# Patient Record
Sex: Male | Born: 1975 | Race: Black or African American | Hispanic: No | Marital: Married | Smoking: Never smoker
Health system: Southern US, Community
[De-identification: ages and names within clinical notes are randomized; demographics above are authoritative.]

## PROBLEM LIST (undated history)

## (undated) DIAGNOSIS — T7840XA Allergy, unspecified, initial encounter: Secondary | ICD-10-CM

## (undated) HISTORY — DX: Allergy, unspecified, initial encounter: T78.40XA

## (undated) HISTORY — PX: EXCISION / CURETTAGE BONE CYST PHALANGES OF FOOT: SUR479

---

## 2016-06-28 ENCOUNTER — Ambulatory Visit (INDEPENDENT_AMBULATORY_CARE_PROVIDER_SITE_OTHER): Payer: BC Managed Care – PPO | Admitting: Physician Assistant

## 2016-06-28 ENCOUNTER — Encounter: Payer: Self-pay | Admitting: Physician Assistant

## 2016-06-28 VITALS — BP 114/86 | HR 68 | Temp 97.9°F | Resp 16 | Ht 70.0 in | Wt 186.0 lb

## 2016-06-28 DIAGNOSIS — Z0000 Encounter for general adult medical examination without abnormal findings: Secondary | ICD-10-CM

## 2016-06-28 NOTE — Progress Notes (Signed)
Patient ID: Kenneth Garcia MRN: [id number], DOB: [birth year] 41 y.o. Date of Encounter: 06/28/2016, 11:03 AM    Chief Complaint: Physical (CPE). New Patient--Establish Care  HPI: 41 y.o. y/o male here as a New Patient--Establish Care Also wants to do CPE.   He reports that he moved here from [place] about a year ago. When living in [place], would have annual physical. Says that it has probably been about 2 years since his last physical. He is agreeable to update this today but he is not fasting but reports that he can easily come fasting another morning. Says that he just lives about 5 minutes from here.  Reports he has no significant past medical history and takes no medicines on a daily basis.  He does not smoke. I have reviewed his family history and there is no premature family history of CAD or CA.   Review of Systems: Consitutional: No fever, chills, fatigue, night sweats, lymphadenopathy, or weight changes. Eyes: No visual changes, eye redness, or discharge. ENT/Mouth: Ears: No otalgia, tinnitus, hearing loss, discharge. Nose: No congestion, rhinorrhea, sinus pain, or epistaxis. Throat: No sore throat, post nasal drip, or teeth pain. Cardiovascular: No CP, palpitations, diaphoresis, DOE, edema, orthopnea, PND. Respiratory: No cough, hemoptysis, SOB, or wheezing. Gastrointestinal: No anorexia, dysphagia, reflux, pain, nausea, vomiting, hematemesis, diarrhea, constipation, BRBPR, or melena. Genitourinary: No dysuria, frequency, urgency, hematuria, incontinence, nocturia, decreased urinary stream, discharge, impotence, or testicular pain/masses. Musculoskeletal: No decreased ROM, myalgias, stiffness, joint swelling, or weakness. Skin: No rash, erythema, lesion changes, pain, warmth, jaundice, or pruritis. Neurological: No headache, dizziness, syncope, seizures, tremors, memory loss, coordination problems, or paresthesias. Psychological: No anxiety, depression, hallucinations,  SI/HI. Endocrine: No fatigue, polydipsia, polyphagia, polyuria, or known diabetes. All other systems were reviewed and are otherwise negative.  Past Medical History:  Diagnosis Date  . Allergy    pollen     Past Surgical History:  Procedure Laterality Date  . EXCISION / CURETTAGE BONE CYST PHALANGES OF FOOT N/A    both left and right implants in both feet to correct flat feet    Home Meds:  No outpatient prescriptions prior to visit.   No facility-administered medications prior to visit.     Allergies: Allergies no known allergies  Social History   Social History  . Marital status: Married    Spouse name: N/A  . Number of children: N/A  . Years of education: N/A   Occupational History  . Not on file.   Social History Main Topics  . Smoking status: Never Smoker  . Smokeless tobacco: Never Used  . Alcohol use No  . Drug use: No  . Sexual activity: Yes   Other Topics Concern  . Not on file   Social History Narrative  . No narrative on file    Family History  Problem Relation Age of Onset  . Hyperlipidemia Father   . Hypertension Father   . Cancer Maternal Aunt   . COPD Maternal Aunt   . Cancer Maternal Uncle   . Diabetes Maternal Grandfather   . Heart disease Maternal Grandfather     Physical Exam: Blood pressure 114/86, pulse 68, temperature 97.9 F (36.6 C), temperature source Oral, resp. rate 16, height  (1.778 m), weight 186 lb (84.4 kg), SpO2 98 %.  General: Well developed, well nourished, AAM. Appears in no acute distress. HEENT: Normocephalic, atraumatic. Conjunctiva pink, sclera non-icteric. Pupils 2 mm constricting to 1 mm, round, regular, and equally reactive to light and accomodation.  EOMI. Internal auditory canal clear. TMs with good cone of light and without pathology. Nasal mucosa pink. Nares are without discharge. No sinus tenderness. Oral mucosa pink. Pharynx without exudate.   Neck: Supple. Trachea midline. No thyromegaly. Full ROM.  No lymphadenopathy. Lungs: Clear to auscultation bilaterally without wheezes, rales, or rhonchi. Breathing is of normal effort and unlabored. Cardiovascular: RRR with S1 S2. No murmurs, rubs, or gallops. Distal pulses 2+ symmetrically. No carotid or abdominal bruits. Abdomen: Soft, non-tender, non-distended with normoactive bowel sounds. No hepatosplenomegaly or masses. No rebound/guarding. No CVA tenderness. No hernias. Musculoskeletal: Full range of motion and 5/5 strength throughout.  Skin: Warm and moist without erythema, ecchymosis, wounds, or rash. Neuro: A+Ox3. CN II-XII grossly intact. Moves all extremities spontaneously. Full sensation throughout. Normal gait.  Psych:  Responds to questions appropriately with a normal affect.   Assessment/Plan:  41 y.o. y/o AA male here for CPE  -1. Encounter for preventive health examination A. Screening Labs: He is not fasting today but states that he can easily return fasting another morning this week. - CBC with Differential/Platelet; Future - COMPLETE METABOLIC PANEL WITH GFR; Future - Lipid panel; Future - TSH; Future  B. Screening For Prostate Cancer: Start at age 82  C. Screening For Colorectal Cancer:  He has no indication to require this until age 64  D. Immunizations: Flu--------------------------N/A Tetanus------------------ he does not know date of last tetanus but we think this is probably up to date. 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 Pneumococcal--------------- no indication to need this until age 44 Shingles Vaccine---N/A at this age  Signed:   [address] [place], [place]  06/28/2016 11:03 AM

## 2016-06-29 ENCOUNTER — Other Ambulatory Visit: Payer: BC Managed Care – PPO

## 2016-06-29 DIAGNOSIS — Z0000 Encounter for general adult medical examination without abnormal findings: Secondary | ICD-10-CM

## 2016-06-29 LAB — COMPLETE METABOLIC PANEL WITH GFR
ALBUMIN: 3.9 g/dL (ref 3.6–5.1)
ALK PHOS: 50 U/L (ref 40–115)
ALT: 27 U/L (ref 9–46)
AST: 25 U/L (ref 10–40)
BILIRUBIN TOTAL: 0.5 mg/dL (ref 0.2–1.2)
BUN: 26 mg/dL — AB (ref 7–25)
CO2: 24 mmol/L (ref 20–31)
Calcium: 8.9 mg/dL (ref 8.6–10.3)
Chloride: 104 mmol/L (ref 98–110)
Creat: 1.69 mg/dL — ABNORMAL HIGH (ref 0.60–1.35)
GFR, Est African American: 58 mL/min — ABNORMAL LOW (ref >=60)
GFR, Est Non African American: 50 mL/min — ABNORMAL LOW (ref >=60)
GLUCOSE: 86 mg/dL (ref 70–99)
Potassium: 4.4 mmol/L (ref 3.5–5.3)
SODIUM: 136 mmol/L (ref 135–146)
TOTAL PROTEIN: 6.4 g/dL (ref 6.1–8.1)

## 2016-06-29 LAB — CBC WITH DIFFERENTIAL/PLATELET
BASOS ABS: 48 {cells}/uL (ref 0–200)
Basophils Relative: 1 %
EOS ABS: 288 {cells}/uL (ref 15–500)
EOS PCT: 6 %
HCT: 41.3 % (ref 38.5–50.0)
HEMOGLOBIN: 13.4 g/dL (ref 13.0–17.0)
Lymphocytes Relative: 46 %
Lymphs Abs: 2208 cells/uL (ref 850–3900)
MCH: 29.1 pg (ref 27.0–33.0)
MCHC: 32.4 g/dL (ref 32.0–36.0)
MCV: 89.6 fL (ref 80.0–100.0)
MPV: 9.8 fL (ref 7.5–12.5)
Monocytes Absolute: 480 cells/uL (ref 200–950)
Monocytes Relative: 10 %
NEUTROS ABS: 1776 {cells}/uL (ref 1500–7800)
NEUTROS PCT: 37 %
Platelets: 223 10*3/uL (ref 140–400)
RBC: 4.61 MIL/uL (ref 4.20–5.80)
RDW: 13.1 % (ref 11.0–15.0)
WBC: 4.8 10*3/uL (ref 3.8–10.8)

## 2016-06-29 LAB — LIPID PANEL
CHOL/HDL RATIO: 3.7 ratio (ref <5.0)
Cholesterol: 163 mg/dL (ref <200)
HDL: 44 mg/dL (ref >40)
LDL Cholesterol: 104 mg/dL — ABNORMAL HIGH (ref <100)
Triglycerides: 74 mg/dL (ref <150)
VLDL: 15 mg/dL (ref <30)

## 2016-06-29 LAB — TSH: TSH: 2.14 m[IU]/L (ref 0.40–4.50)

## 2016-07-03 ENCOUNTER — Encounter: Payer: Self-pay | Admitting: Physician Assistant

## 2016-09-06 ENCOUNTER — Encounter: Payer: Self-pay | Admitting: Physician Assistant

## 2016-09-06 ENCOUNTER — Ambulatory Visit (INDEPENDENT_AMBULATORY_CARE_PROVIDER_SITE_OTHER): Payer: BC Managed Care – PPO | Admitting: Physician Assistant

## 2016-09-06 ENCOUNTER — Ambulatory Visit
Admission: RE | Admit: 2016-09-06 | Discharge: 2016-09-06 | Disposition: A | Discharge status: Home / Self Care | Payer: BC Managed Care – PPO | Source: Ambulatory Visit | Attending: Physician Assistant | Admitting: Physician Assistant

## 2016-09-06 ENCOUNTER — Other Ambulatory Visit: Payer: Self-pay | Admitting: Physician Assistant

## 2016-09-06 VITALS — BP 110/70 | HR 86 | Temp 98.0°F | Resp 14 | Ht 70.0 in | Wt 194.2 lb

## 2016-09-06 DIAGNOSIS — M25552 Pain in left hip: Secondary | ICD-10-CM

## 2016-09-06 NOTE — Progress Notes (Signed)
    Patient ID: Kenneth AngCalvin Garcia MRN: [id number], DOB: [birth year], 41 y.o. Date of Encounter: 09/06/2016, 8:52 AM    Chief Complaint:  Chief Complaint  Patient presents with  . discomfort between left groin and left femur    ongoing for 1 year      HPI: 41 y.o. year old male presents with above.   Patient states that he has been noticing this off and on for about a year. Says that it hasn't been anything severe but since it wasn't going away, he felt he should come and get it checked. Points his finger along left groin crease.  Says that he feels discomfort in that area  --- when he first wakes up in the morning,   ---when he stands up after sitting a long time,   ---and when he internally rotates hip.   For his work he sits at a desk all day. Says that he exercises 4 days a week. Exercises that he does that involves hip region includes squats and lunges. Says when he does squats and lunges he does not feel any pain in this hip/groin crease while he's doing these exercises. Says that while he is doing these exercises both sides feel the same.  Had noticed no bulge or increased symptoms with these maneuvers to indicate hernia.  However says that when he does his stretches at the end of his exercise, that when he stretches the left hip flexor he feels relief and feels this more on the left than he does the right.  States that he has never had any significant injury to this area of his body that he is aware of. Says that he did run track and also played football elementary through high school.  Has had no pain numbness or tingling down the lateral aspect of the leg. Has had no pain in his low back. No other concerns to address today.     Home Meds:   No outpatient prescriptions prior to visit.   No facility-administered medications prior to visit.     Allergies: No Known Allergies    Review of Systems: See HPI for pertinent ROS. All other ROS negative.    Physical  Exam: Blood pressure 110/70, pulse 86, temperature 98 F (36.7 C), temperature source Oral, resp. rate 14, height 5\' 10"  (1.778 m), weight 194 lb 3.2 oz (88.1 kg), SpO2 98 %., Body mass index is 27.86 kg/m. General: WNWD  AAM. Appears in no acute distress. Neck: Supple. No thyromegaly. No lymphadenopathy. Lungs: Clear bilaterally to auscultation without wheezes, rales, or rhonchi. Breathing is unlabored. Heart: Regular rhythm. No murmurs, rubs, or gallops. Msk:  Strength and tone normal for age. Full range of motion at hip joint. Strength normal. Extremities/Skin: Warm and dry.  No rashes . Neuro: Alert and oriented X 3. Moves all extremities spontaneously. Gait is normal. CNII-XII grossly in tact. Psych:  Responds to questions appropriately with a normal affect.     ASSESSMENT AND PLAN:  41 y.o. year old male with  1. Left hip pain Will obtain x-ray hip. Follow-up with him once again that report. If x-ray shows no significant abnormality then I suspect his symptoms are secondary to tight muscles in that area. I have demonstrated 2 specific stretches for him to do to stretch hip flexors. - DG Arthro Hip Left; Future   Signed, Shon HaleMary Beth AmagansettDixon, [place]PA, [facility] 09/06/2016 8:52 AM

## 2017-02-27 HISTORY — PX: VASECTOMY: SHX75

## 2017-03-11 ENCOUNTER — Encounter: Payer: Self-pay | Admitting: Physician Assistant

## 2017-05-08 ENCOUNTER — Encounter: Payer: Self-pay | Admitting: Physician Assistant

## 2017-05-08 DIAGNOSIS — R7989 Other specified abnormal findings of blood chemistry: Secondary | ICD-10-CM

## 2017-05-08 DIAGNOSIS — R945 Abnormal results of liver function studies: Principal | ICD-10-CM

## 2017-05-15 ENCOUNTER — Ambulatory Visit
Admission: RE | Admit: 2017-05-15 | Discharge: 2017-05-15 | Disposition: A | Discharge status: Home / Self Care | Payer: BC Managed Care – PPO | Source: Ambulatory Visit | Attending: Physician Assistant | Admitting: Physician Assistant

## 2017-05-15 DIAGNOSIS — R945 Abnormal results of liver function studies: Principal | ICD-10-CM

## 2017-05-15 DIAGNOSIS — R7989 Other specified abnormal findings of blood chemistry: Secondary | ICD-10-CM

## 2017-05-16 ENCOUNTER — Telehealth: Payer: Self-pay

## 2017-05-16 NOTE — Telephone Encounter (Signed)
Call was placed to patient to discuss ultrasound results. Patient would like to know if he could have further testing done such as Ct scan or MRI  To find out why his GGT  is elevated because his life insurance company is not giving him a good rate due to this problem.

## 2017-05-16 NOTE — Telephone Encounter (Signed)
Further workup of this is not indicated.  I just further researched this issue. The following is copied directly from medical literature:  "GGT is found in hepatocytes and biliary epithelial cells, as well as in the kidney, seminal vesicles, pancreas, spleen, heart, and brain. "  This is something non-specific that was measured on his lab work.  It has no clinical significance.

## 2017-05-16 NOTE — Telephone Encounter (Signed)
-----   Message from Dorena BodoMary B Dixon, PA-C sent at 05/15/2017 11:17 AM EDT ----- Ultrasound test is normal. Elevated GGT insignificant. Other LFTs normal and ultrasound normal.

## 2017-05-16 NOTE — Telephone Encounter (Signed)
Call placed to patient he is aware of provider recommendations 

## 2017-07-02 ENCOUNTER — Other Ambulatory Visit: Payer: Self-pay

## 2017-07-02 ENCOUNTER — Ambulatory Visit (INDEPENDENT_AMBULATORY_CARE_PROVIDER_SITE_OTHER): Payer: BC Managed Care – PPO | Admitting: Physician Assistant

## 2017-07-02 ENCOUNTER — Encounter: Payer: Self-pay | Admitting: Physician Assistant

## 2017-07-02 VITALS — BP 119/78 | HR 61 | Temp 97.6°F | Resp 14 | Ht 71.0 in | Wt 183.4 lb

## 2017-07-02 DIAGNOSIS — Z0000 Encounter for general adult medical examination without abnormal findings: Secondary | ICD-10-CM

## 2017-07-02 NOTE — Progress Notes (Signed)
Patient ID: Kenneth Garcia MRN: [id number], DOB: 1975/06/19 43 y.o. Date of Encounter: 07/02/2017, 8:58 AM    Chief Complaint: Physical (CPE). New Patient--Establish Care  HPI: 42 y.o. y/o male    06/28/2016: here as a New Patient--Establish Care Also wants to do CPE.   He reports that he moved here from [place] about a year ago. When living in [place], would have annual physical. Says that it has probably been about 2 years since his last physical. He is agreeable to update this today but he is not fasting but reports that he can easily come fasting another morning. Says that he just lives about 5 minutes from here.  Reports he has no significant past medical history and takes no medicines on a daily basis.  He does not smoke. I have reviewed his family history and there is no premature family history of CAD or CA.   07/02/2017: He presents for complete physical exam again today. He reports that everything has been stable over the past year. He has no specific concerns to address today. He reports that there also have been no updates to his family's medical history over the past year. He is fasting to do lab work today.   Review of Systems: Consitutional: No fever, chills, fatigue, night sweats, lymphadenopathy, or weight changes. Eyes: No visual changes, eye redness, or discharge. ENT/Mouth: Ears: No otalgia, tinnitus, hearing loss, discharge. Nose: No congestion, rhinorrhea, sinus pain, or epistaxis. Throat: No sore throat, post nasal drip, or teeth pain. Cardiovascular: No CP, palpitations, diaphoresis, DOE, edema, orthopnea, PND. Respiratory: No cough, hemoptysis, SOB, or wheezing. Gastrointestinal: No anorexia, dysphagia, reflux, pain, nausea, vomiting, hematemesis, diarrhea, constipation, BRBPR, or melena. Genitourinary: No dysuria, frequency, urgency, hematuria, incontinence, nocturia, decreased urinary stream, discharge, impotence, or testicular  pain/masses. Musculoskeletal: No decreased ROM, myalgias, stiffness, joint swelling, or weakness. Skin: No rash, erythema, lesion changes, pain, warmth, jaundice, or pruritis. Neurological: No headache, dizziness, syncope, seizures, tremors, memory loss, coordination problems, or paresthesias. Psychological: No anxiety, depression, hallucinations, SI/HI. Endocrine: No fatigue, polydipsia, polyphagia, polyuria, or known diabetes. All other systems were reviewed and are otherwise negative.  Past Medical History:  Diagnosis Date  . Allergy    pollen     Past Surgical History:  Procedure Laterality Date  . EXCISION / CURETTAGE BONE CYST PHALANGES OF FOOT N/A    both left and right implants in both feet to correct flat feet    Home Meds:  Outpatient Medications Prior to Visit  Medication Sig Dispense Refill  . glucosamine-chondroitin 500-400 MG tablet Take 1 tablet by mouth 3 (three) times daily. Patient  taking 500 mgTID    . Multiple Vitamin (MULTIVITAMIN) tablet Take 4 tablets by mouth daily. Garden of life vitamin code men    . Omega-3 Fatty Acids (FISH OIL PO) Take 1 capsule by mouth daily.    . Probiotic Product (ALOE 91478 & PROBIOTICS PO) Take by mouth. Patient taking 10 billion daily     No facility-administered medications prior to visit.     Allergies: No Known Allergies  Social History   Socioeconomic History  . Marital status: Married    Spouse name: Not on file  . Number of children: Not on file  . Years of education: Not on file  . Highest education level: Not on file  Occupational History  . Not on file  Social Needs  . Financial resource strain: Not on file  . Food insecurity:    Worry: Not on file  Inability: Not on file  . Transportation needs:    Medical: Not on file    Non-medical: Not on file  Tobacco Use  . Smoking status: Never Smoker  . Smokeless tobacco: Never Used  Substance and Sexual Activity  . Alcohol use: No  . Drug use: No  .  Sexual activity: Yes  Lifestyle  . Physical activity:    Days per week: Not on file    Minutes per session: Not on file  . Stress: Not on file  Relationships  . Social connections:    Talks on phone: Not on file    Gets together: Not on file    Attends religious service: Not on file    Active member of club or organization: Not on file    Attends meetings of clubs or organizations: Not on file    Relationship status: Not on file  . Intimate partner violence:    Fear of current or ex partner: Not on file    Emotionally abused: Not on file    Physically abused: Not on file    Forced sexual activity: Not on file  Other Topics Concern  . Not on file  Social History Narrative  . Not on file    Family History  Problem Relation Age of Onset  . Hyperlipidemia Father   . Hypertension Father   . Cancer Maternal Aunt   . COPD Maternal Aunt   . Cancer Maternal Uncle   . Diabetes Maternal Grandfather   . Heart disease Maternal Grandfather     Physical Exam: Blood pressure 119/78, pulse 61, temperature 97.6 F (36.4 C), temperature source Oral, resp. rate 14, height  (1.803 m), weight 83.2 kg (183 lb 6.4 oz), SpO2 97 %., Body mass index is 25.58 kg/m. General: WNWD AAM. Appears in no acute distress. Head: Normocephalic, atraumatic, eyes without discharge, sclera non-icteric, nares are without discharge. Bilateral auditory canals clear, TM's are without perforation, pearly grey and translucent with reflective cone of light bilaterally. Oral cavity moist, posterior pharynx without exudate, erythema, peritonsillar abscess, or post nasal drip.  Neck: Supple. No thyromegaly. No lymphadenopathy.  No carotid bruits. Lungs: Clear bilaterally to auscultation without wheezes, rales, or rhonchi. Breathing is unlabored. Heart: RRR with S1 S2. No murmurs, rubs, or gallops. Abdomen: Soft, non-tender, non-distended with normoactive bowel sounds. No hepatomegaly. No rebound/guarding. No obvious  abdominal masses. Musculoskeletal:  Strength and tone normal for age. Extremities/Skin: Warm and dry. No clubbing or cyanosis. No edema. No rashes or suspicious lesions. Neuro: Alert and oriented X 3. Moves all extremities spontaneously. Gait is normal. CNII-XII grossly in tact. Psych:  Responds to questions appropriately with a normal affect.   Assessment/Plan:  42 y.o. y/o AA male here for CPE  -1. Encounter for preventive health examination A. Screening Labs: 07/02/2017: He is fasting  - CBC with Differential/Platelet - COMPLETE METABOLIC PANEL WITH GFR - Lipid panel - HIV antibody  B. Screening For Prostate Cancer: 07/02/2017:Start at age 70  C. Screening For Colorectal Cancer:  07/02/2017:He has no indication to require this until age 80  D. Immunizations: Flu--------------------------N/A Tetanus------------------ he does not know date of last tetanus but we think this is probably up to date. 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 Pneumococcal--------------- no indication to need this until age 76 Shingles Vaccine---N/A at this age  Signed:   [address] [place], [place]  07/02/2017 8:58 AM

## 2017-07-03 LAB — CBC WITH DIFFERENTIAL/PLATELET
BASOS PCT: 0.8 %
Basophils Absolute: 38 cells/uL (ref 0–200)
Eosinophils Absolute: 211 cells/uL (ref 15–500)
Eosinophils Relative: 4.4 %
HEMATOCRIT: 42.6 % (ref 38.5–50.0)
Hemoglobin: 13.9 g/dL (ref 13.2–17.1)
LYMPHS ABS: 2002 {cells}/uL (ref 850–3900)
MCH: 28.2 pg (ref 27.0–33.0)
MCHC: 32.6 g/dL (ref 32.0–36.0)
MCV: 86.4 fL (ref 80.0–100.0)
MPV: 10.5 fL (ref 7.5–12.5)
Monocytes Relative: 8.6 %
Neutro Abs: 2136 cells/uL (ref 1500–7800)
Neutrophils Relative %: 44.5 %
PLATELETS: 230 10*3/uL (ref 140–400)
RBC: 4.93 10*6/uL (ref 4.20–5.80)
RDW: 12.6 % (ref 11.0–15.0)
TOTAL LYMPHOCYTE: 41.7 %
WBC: 4.8 10*3/uL (ref 3.8–10.8)
WBCMIX: 413 {cells}/uL (ref 200–950)

## 2017-07-03 LAB — COMPLETE METABOLIC PANEL WITH GFR
AG RATIO: 1.7 (calc) (ref 1.0–2.5)
ALT: 30 U/L (ref 9–46)
AST: 20 U/L (ref 10–40)
Albumin: 4.3 g/dL (ref 3.6–5.1)
Alkaline phosphatase (APISO): 67 U/L (ref 40–115)
BILIRUBIN TOTAL: 0.5 mg/dL (ref 0.2–1.2)
BUN/Creatinine Ratio: 11 (calc) (ref 6–22)
BUN: 16 mg/dL (ref 7–25)
CO2: 28 mmol/L (ref 20–32)
CREATININE: 1.48 mg/dL — AB (ref 0.60–1.35)
Calcium: 9.4 mg/dL (ref 8.6–10.3)
Chloride: 105 mmol/L (ref 98–110)
GFR, Est African American: 68 mL/min/{1.73_m2} (ref >=60)
GFR, Est Non African American: 59 mL/min/{1.73_m2} — ABNORMAL LOW (ref >=60)
GLOBULIN: 2.6 g/dL (ref 1.9–3.7)
Glucose, Bld: 90 mg/dL (ref 65–99)
POTASSIUM: 4.7 mmol/L (ref 3.5–5.3)
Sodium: 139 mmol/L (ref 135–146)
TOTAL PROTEIN: 6.9 g/dL (ref 6.1–8.1)

## 2017-07-03 LAB — LIPID PANEL
CHOL/HDL RATIO: 4.8 (calc) (ref <5.0)
CHOLESTEROL: 218 mg/dL — AB (ref <200)
HDL: 45 mg/dL (ref >40)
LDL Cholesterol (Calc): 156 mg/dL (calc) — ABNORMAL HIGH
Non-HDL Cholesterol (Calc): 173 mg/dL (calc) — ABNORMAL HIGH (ref <130)
TRIGLYCERIDES: 71 mg/dL (ref <150)

## 2017-07-03 LAB — HIV ANTIBODY (ROUTINE TESTING W REFLEX): HIV 1&2 Ab, 4th Generation: NONREACTIVE

## 2018-02-21 IMAGING — DX DG HIP (WITH OR WITHOUT PELVIS) 2-3V*L*
2 series · 2 of 2 positions shown · non-contrast
Comparison: None.

CLINICAL DATA: Left hip pain

EXAM:
DG HIP (WITH OR WITHOUT PELVIS) 2-3V LEFT

[dg hip unilat w or w/o pelvis 2-3 views  (1 of 2)]
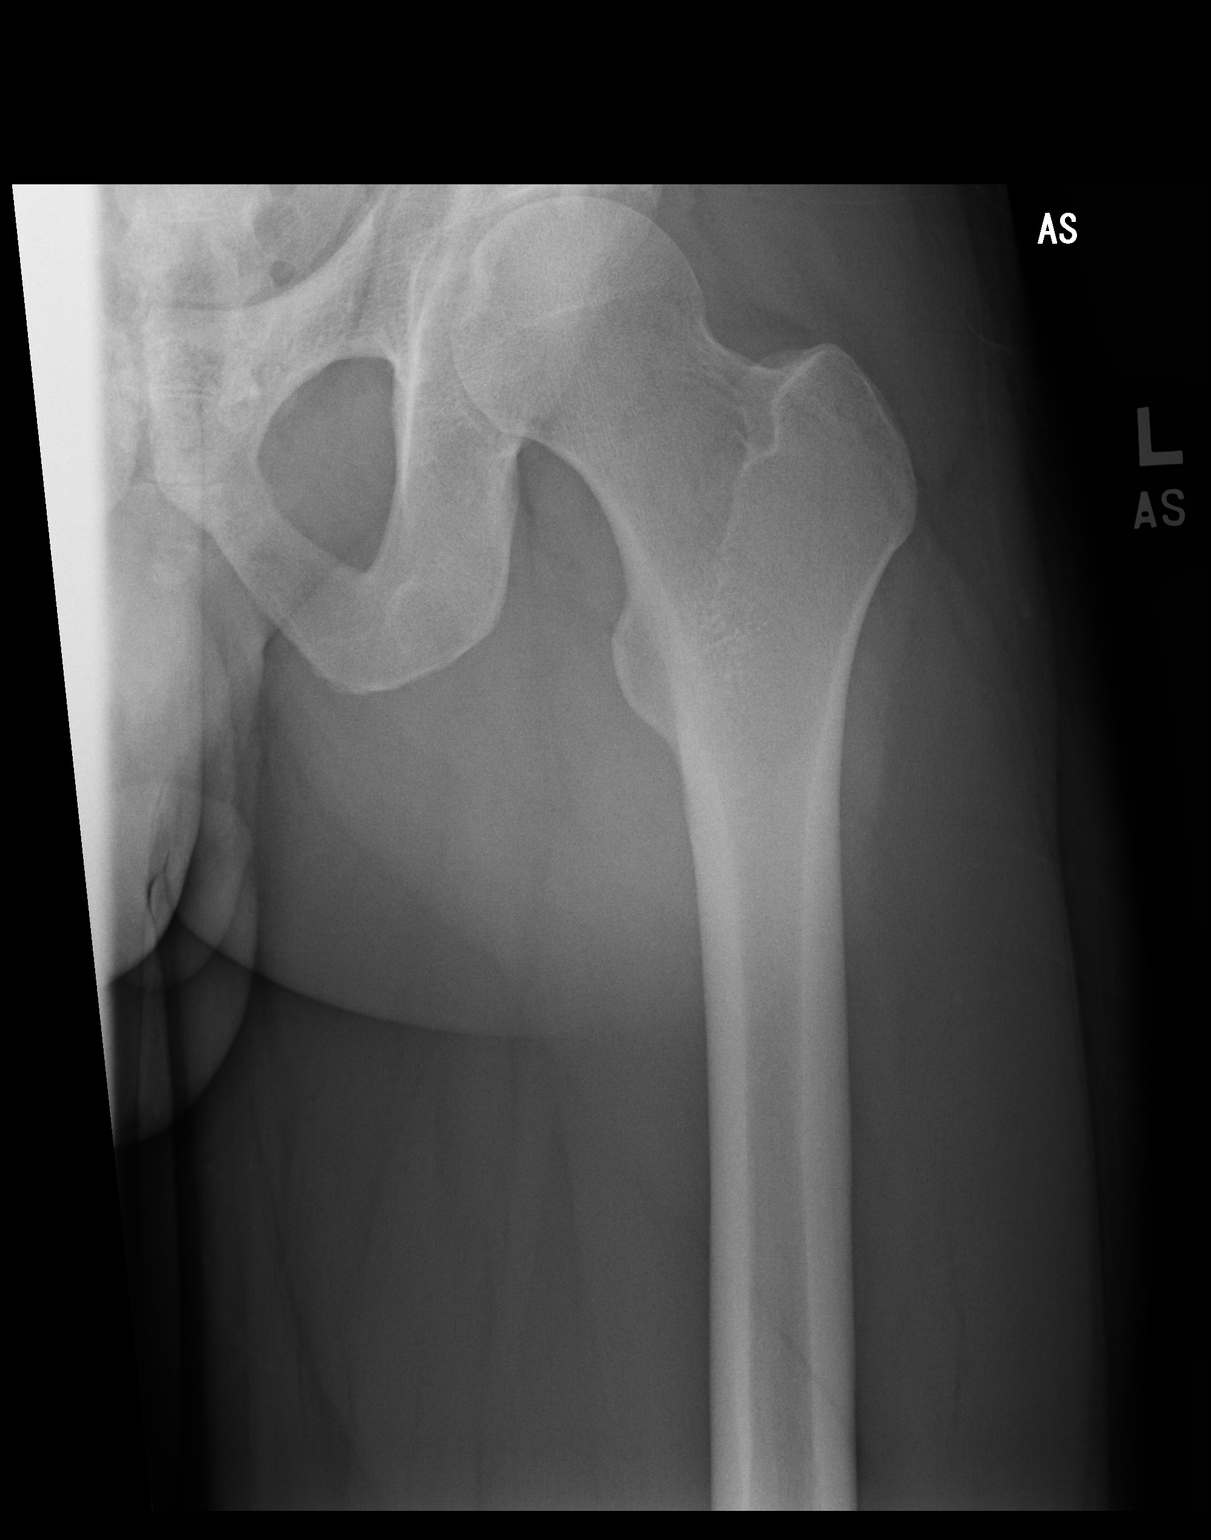

[dg hip unilat w or w/o pelvis 2-3 views  (2 of 2)]
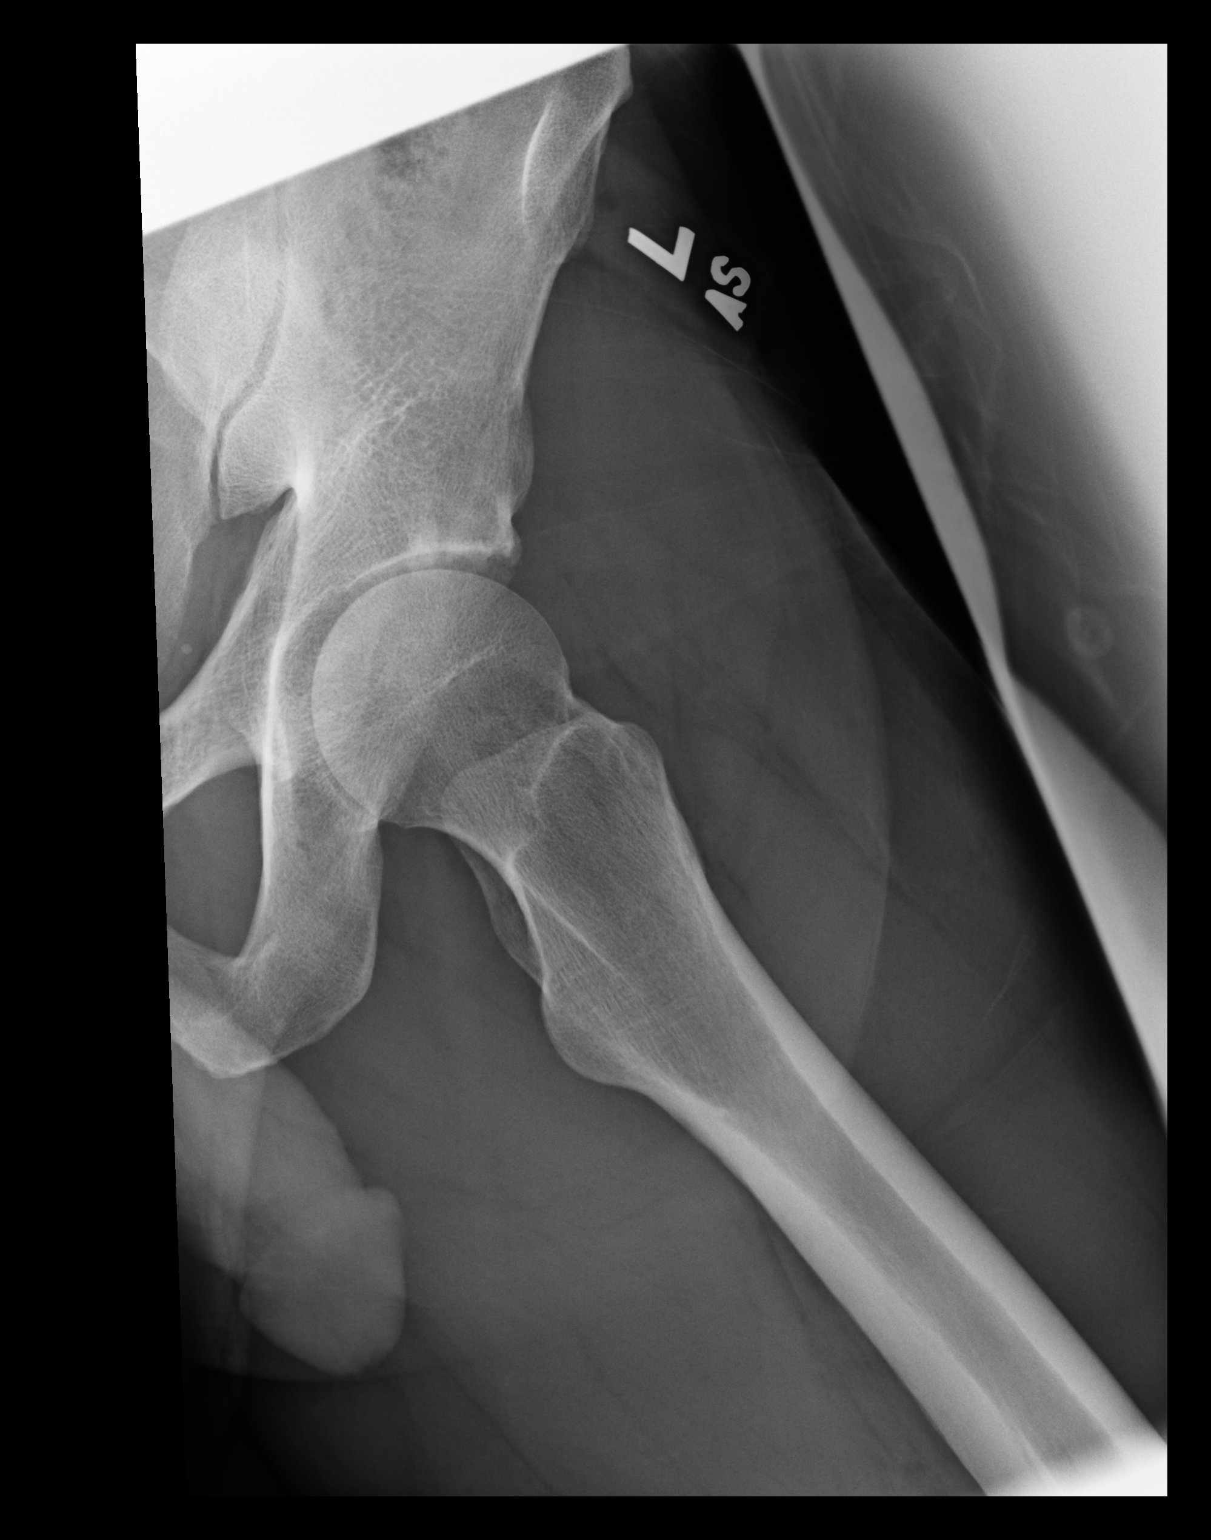

[2 of 2 positions shown; findings below may reference images not displayed]

FINDINGS: There is no evidence of hip fracture or dislocation. There is no
evidence of arthropathy or other focal bone abnormality.
IMPRESSION: Negative.

## 2018-07-04 ENCOUNTER — Encounter: Payer: BC Managed Care – PPO | Admitting: Physician Assistant

## 2018-08-07 ENCOUNTER — Other Ambulatory Visit: Payer: Self-pay

## 2018-08-07 ENCOUNTER — Ambulatory Visit (INDEPENDENT_AMBULATORY_CARE_PROVIDER_SITE_OTHER): Payer: BC Managed Care – PPO | Admitting: Family Medicine

## 2018-08-07 ENCOUNTER — Encounter: Payer: Self-pay | Admitting: Family Medicine

## 2018-08-07 VITALS — BP 108/74 | HR 66 | Temp 98.3°F | Resp 18 | Ht 71.0 in | Wt 186.2 lb

## 2018-08-07 DIAGNOSIS — E785 Hyperlipidemia, unspecified: Secondary | ICD-10-CM | POA: Diagnosis not present

## 2018-08-07 DIAGNOSIS — Z0001 Encounter for general adult medical examination with abnormal findings: Secondary | ICD-10-CM

## 2018-08-07 DIAGNOSIS — Z0000 Encounter for general adult medical examination without abnormal findings: Secondary | ICD-10-CM

## 2018-08-07 NOTE — Progress Notes (Signed)
Patient: Kenneth Garcia, Male    DOB: 18-Dec-1975, 43 y.o.   MRN: [id number] Visit Date: 08/07/2018  Today's Provider: Delsa Grana, PA-C   Chief Complaint  Patient presents with  . Annual Exam   Subjective:    Annual physical exam Kenneth Garcia is a 43 y.o. male who presents today for health maintenance and complete physical. He feels well. He reports exercising not at all right me. He reports he is sleeping well.  ----------------------------------------------------------------- [employer], married with 5 children.  In the past GGT elevated with 2015 with life insurance lab tests  In reviewing patient's previous lab work he did have elevated cholesterol he states he was eating a lot of eggs at that time and has since changed his diet including decreased amount of whole eggs, increasing oatmeal.  He does not currently exercising.  Denies any weight changes, urinary sx, GU/ED concerns or sx.  Family hx, social hx, PMHx all reviewed.  Review of Systems  Constitutional: Negative.   HENT: Negative.   Eyes: Negative.   Respiratory: Negative.   Cardiovascular: Negative.   Gastrointestinal: Negative.   Endocrine: Negative.   Genitourinary: Negative.   Musculoskeletal: Negative.   Skin: Negative.   Allergic/Immunologic: Negative.   Neurological: Negative.   Hematological: Negative.   Psychiatric/Behavioral: Negative.   All other systems reviewed and are negative.   Social History      He  reports that he has never smoked. He has never used smokeless tobacco. He reports that he does not drink alcohol or use drugs.       Social History   Socioeconomic History  . Marital status: Married    Spouse name: Not on file  . Number of children: Not on file  . Years of education: Not on file  . Highest education level: Not on file  Occupational History  . Not on file  Social Needs  . Financial resource strain: Not on file  . Food insecurity:    Worry: Not on  file    Inability: Not on file  . Transportation needs:    Medical: Not on file    Non-medical: Not on file  Tobacco Use  . Smoking status: Never Smoker  . Smokeless tobacco: Never Used  Substance and Sexual Activity  . Alcohol use: No  . Drug use: No  . Sexual activity: Yes  Lifestyle  . Physical activity:    Days per week: Not on file    Minutes per session: Not on file  . Stress: Not on file  Relationships  . Social connections:    Talks on phone: Not on file    Gets together: Not on file    Attends religious service: Not on file    Active member of club or organization: Not on file    Attends meetings of clubs or organizations: Not on file    Relationship status: Not on file  Other Topics Concern  . Not on file  Social History Narrative  . Not on file    Past Medical History:  Diagnosis Date  . Allergy    pollen     Patient Active Problem List   Diagnosis Date Noted  . Hyperlipidemia 08/07/2018    Past Surgical History:  Procedure Laterality Date  . EXCISION / CURETTAGE BONE CYST PHALANGES OF FOOT N/A    both left and right implants in both feet to correct flat feet    Family History        Family  Status  Relation Name Status  . Mother  Alive  . Father  Alive  . Mat Aunt  Deceased       breast  . Mat Uncle  Deceased       nonhodkins lymphoma  . MGF  (Not Specified)  . Sister  Alive  . Brother  Alive  . Sister  Alive        His family history includes COPD in his maternal aunt; Cancer in his maternal aunt and maternal uncle; Diabetes in his maternal grandfather; Heart disease in his maternal grandfather; Hyperlipidemia in his father; Hypertension in his father.      No Known Allergies   Current Outpatient Medications:  Marland Kitchen  Multiple Vitamin (MULTIVITAMIN) tablet, Take 4 tablets by mouth daily. Garden of life vitamin code men, Disp: , Rfl:  .  Omega-3 Fatty Acids (FISH OIL PO), Take 1 capsule by mouth daily., Disp: , Rfl:    Patient Care Team:  Delsa Grana, PA-C as PCP - General (Family Medicine)      Objective:   Vitals: BP 108/74   Pulse 66   Temp 98.3 F (36.8 C)   Resp 18   Ht 5' 11"  (1.803 m)   Wt 186 lb 3.2 oz (84.5 kg)   SpO2 98%   BMI 25.97 kg/m    Vitals:   08/07/18 0829  BP: 108/74  Pulse: 66  Resp: 18  Temp: 98.3 F (36.8 C)  SpO2: 98%  Weight: 186 lb 3.2 oz (84.5 kg)  Height: 5' 11"  (1.803 m)     Physical Exam Constitutional:      General: He is not in acute distress.    Appearance: Normal appearance. He is well-developed and normal weight. He is not ill-appearing, toxic-appearing or diaphoretic.  HENT:     Head: Normocephalic and atraumatic.     Jaw: No trismus.     Right Ear: Tympanic membrane, ear canal and external ear normal.     Left Ear: Tympanic membrane, ear canal and external ear normal.     Nose: No mucosal edema or rhinorrhea.     Right Sinus: No maxillary sinus tenderness or frontal sinus tenderness.     Left Sinus: No maxillary sinus tenderness or frontal sinus tenderness.     Mouth/Throat:     Pharynx: Uvula midline. No oropharyngeal exudate, posterior oropharyngeal erythema or uvula swelling.  Eyes:     General: Lids are normal.     Conjunctiva/sclera: Conjunctivae normal.     Pupils: Pupils are equal, round, and reactive to light.  Neck:     Musculoskeletal: Normal range of motion and neck supple.     Trachea: Trachea and phonation normal. No tracheal deviation.  Cardiovascular:     Rate and Rhythm: Regular rhythm.     Pulses: Normal pulses.          Radial pulses are 2+ on the right side and 2+ on the left side.       Posterior tibial pulses are 2+ on the right side and 2+ on the left side.     Heart sounds: Normal heart sounds. No murmur. No friction rub. No gallop.   Pulmonary:     Effort: Pulmonary effort is normal.     Breath sounds: Normal breath sounds. No wheezing, rhonchi or rales.  Abdominal:     General: Bowel sounds are normal. There is no distension.      Palpations: Abdomen is soft.     Tenderness: There is no  abdominal tenderness. There is no guarding or rebound.  Musculoskeletal: Normal range of motion.  Skin:    General: Skin is warm and dry.     Capillary Refill: Capillary refill takes less than 2 seconds.     Findings: No rash.  Neurological:     Mental Status: He is alert and oriented to person, place, and time.     Gait: Gait normal.  Psychiatric:        Speech: Speech normal.        Behavior: Behavior normal.      Depression Screen PHQ 2/9 Scores 08/07/2018 07/02/2017 06/28/2016  PHQ - 2 Score 0 0 0  PHQ- 9 Score - - 0     Office Visit from 08/07/2018 in [facility]  AUDIT-C Score  0        Assessment & Plan:     Routine Health Maintenance and Physical Exam  Exercise Activities and Dietary recommendations - start some aerobic exercise, increase water intake, continue cholesterol diet low in saturated and trans fat Discussed health benefits of physical activity, and encouraged him to engage in regular exercise appropriate for his age and condition.   Immunization History  Administered Date(s) Administered  . Hepatitis A 08/15/2000, 05/07/2001  . Influenza Split 01/25/2004  . Influenza Whole 01/31/2001, 01/09/2002  . Influenza-Unspecified 03/18/2003, 11/28/2010  . MMR 12/27/1978  . Meningococcal Conjugate 10/29/2003  . OPV 09/26/1982  . Td 08/15/2000  . Tdap 11/05/2010    Health Maintenance  Topic Date Due  . INFLUENZA VACCINE  09/28/2018  . TETANUS/TDAP  11/04/2020  . HIV Screening  Completed       Problem List Items Addressed This Visit      Other   Hyperlipidemia    Recheck FLP today Has been working on dietary/lifestyle changes No past meds to treat Discussed rechecking again in 6 months to monitor       Other Visit Diagnoses    Encounter for annual physical exam    -  Primary   Relevant Orders   Comprehensive metabolic panel   CBC with Differential   HIV Antibody (routine  testing w rflx)   Lipid Panel      Pt notes always having sCr elevated, and his mother has the same thing.  We discussed serum creatinine and chronic kidney disease, will see what the lab work shows today and decide if we need to repeat labs after pushing hydration or if we may possibly want to add on a low-dose ACE inhibitor for renal protection.    PT previously also had elevated LFT's 2 x in the past, worked up previously by last PCP as same practice here with hepatitis panel and RUQ [place] everything was negative and labs returned to normal - will be rechecking with CMP.  Pt does not drink so no ETOH involved.    Delsa Grana, PA-C 08/07/18 1:30 PM  [facility]

## 2018-08-07 NOTE — Assessment & Plan Note (Signed)
Recheck FLP today Has been working on dietary/lifestyle changes No past meds to treat Discussed rechecking again in 6 months to monitor

## 2018-08-07 NOTE — Patient Instructions (Signed)
Health Maintenance, Male A healthy lifestyle and preventive care is important for your health and wellness. Ask your health care provider about what schedule of regular examinations is right for you. What should I know about weight and diet? Eat a Healthy Diet  Eat plenty of vegetables, fruits, whole grains, low-fat dairy products, and lean protein.  Do not eat a lot of foods high in solid fats, added sugars, or salt.  Maintain a Healthy Weight Regular exercise can help you achieve or maintain a healthy weight. You should:  Do at least 150 minutes of exercise each week. The exercise should increase your heart rate and make you sweat (moderate-intensity exercise).  Do strength-training exercises at least twice a week. Watch Your Levels of Cholesterol and Blood Lipids  Have your blood tested for lipids and cholesterol every 5 years starting at 41 years of age. If you are at high risk for heart disease, you should start having your blood tested when you are 41 years old. You may need to have your cholesterol levels checked more often if: ? Your lipid or cholesterol levels are high. ? You are older than 41 years of age. ? You are at high risk for heart disease. What should I know about cancer screening? Many types of cancers can be detected early and may often be prevented. Lung Cancer  You should be screened every year for lung cancer if: ? You are a current smoker who has smoked for at least 30 years. ? You are a former smoker who has quit within the past 15 years.  Talk to your health care provider about your screening options, when you should start screening, and how often you should be screened. Colorectal Cancer  Routine colorectal cancer screening usually begins at 41 years of age and should be repeated every 5-10 years until you are 41 years old. You may need to be screened more often if early forms of precancerous polyps or small growths are found. Your health care provider may  recommend screening at an earlier age if you have risk factors for colon cancer.  Your health care provider may recommend using home test kits to check for hidden blood in the stool.  A small camera at the end of a tube can be used to examine your colon (sigmoidoscopy or colonoscopy). This checks for the earliest forms of colorectal cancer. Prostate and Testicular Cancer  Depending on your age and overall health, your health care provider may do certain tests to screen for prostate and testicular cancer.  Talk to your health care provider about any symptoms or concerns you have about testicular or prostate cancer. Skin Cancer  Check your skin from head to toe regularly.  Tell your health care provider about any new moles or changes in moles, especially if: ? There is a change in a moles size, shape, or color. ? You have a mole that is larger than a pencil eraser.  Always use sunscreen. Apply sunscreen liberally and repeat throughout the day.  Protect yourself by wearing long sleeves, pants, a wide-brimmed hat, and sunglasses when outside. What should I know about heart disease, diabetes, and high blood pressure?  If you are 83-64 years of age, have your blood pressure checked every 3-5 years. If you are 77 years of age or older, have your blood pressure checked every year. You should have your blood pressure measured twice--once when you are at a hospital or clinic, and once when you are not at a hospital  or clinic. Record the average of the two measurements. To check your blood pressure when you are not at a hospital or clinic, you can use: ? An automated blood pressure machine at a pharmacy. ? A home blood pressure monitor.  Talk to your health care provider about your target blood pressure.  If you are between 6445-41 years old, ask your health care provider if you should take aspirin to prevent heart disease.  Have regular diabetes screenings by checking your fasting blood sugar  level. ? If you are at a normal weight and have a low risk for diabetes, have this test once every three years after the age of 41. ? If you are overweight and have a high risk for diabetes, consider being tested at a younger age or more often.  A one-time screening for abdominal aortic aneurysm (AAA) by ultrasound is recommended for men aged 65-75 years who are current or former smokers. What should I know about preventing infection? Hepatitis B If you have a higher risk for hepatitis B, you should be screened for this virus. Talk with your health care provider to find out if you are at risk for hepatitis B infection. Hepatitis C Blood testing is recommended for:  Everyone born from [id number] through 1963.  Anyone with known risk factors for hepatitis C. Sexually Transmitted Diseases (STDs)  You should be screened each year for STDs including gonorrhea and chlamydia if: ? You are sexually active and are younger than 41 years of age. ? You are older than 41 years of age and your health care provider tells you that you are at risk for this type of infection. ? Your sexual activity has changed since you were last screened and you are at an increased risk for chlamydia or gonorrhea. Ask your health care provider if you are at risk.  Talk with your health care provider about whether you are at high risk of being infected with HIV. Your health care provider may recommend a prescription medicine to help prevent HIV infection. What else can I do?  Schedule regular health, dental, and eye exams.  Stay current with your vaccines (immunizations).  Do not use any tobacco products, such as cigarettes, chewing tobacco, and e-cigarettes. If you need help quitting, ask your health care provider.  Limit alcohol intake to no more than 2 drinks per day. One drink equals 12 ounces of beer, 5 ounces of wine, or 1 ounces of hard liquor.  Do not use street drugs.  Do not share needles.  Ask your health  care provider for help if you need support or information about quitting drugs.  Tell your health care provider if you often feel depressed.  Tell your health care provider if you have ever been abused or do not feel safe at home. This information is not intended to replace advice given to you by your health care provider. Make sure you discuss any questions you have with your health care provider. Document Released: 08/12/2007 Document Revised: 10/13/2015 Document Reviewed: 11/17/2014 Elsevier Interactive Patient Education  2019 Elsevier Inc.   High Cholesterol  High cholesterol is a condition in which the blood has high levels of a white, waxy, fat-like substance (cholesterol). The human body needs small amounts of cholesterol. The liver makes all the cholesterol that the body needs. Extra (excess) cholesterol comes from the food that we eat. Cholesterol is carried from the liver by the blood through the blood vessels. If you have high cholesterol, deposits (plaques) may  build up on the walls of your blood vessels (arteries). Plaques make the arteries narrower and stiffer. Cholesterol plaques increase your risk for heart attack and stroke. Work with your health care provider to keep your cholesterol levels in a healthy range. What increases the risk? This condition is more likely to develop in people who:  Eat foods that are high in animal fat (saturated fat) or cholesterol.  Are overweight.  Are not getting enough exercise.  Have a family history of high cholesterol. What are the signs or symptoms? There are no symptoms of this condition. How is this diagnosed? This condition may be diagnosed from the results of a blood test.  If you are older than age 46, your health care provider may check your cholesterol every 4-6 years.  You may be checked more often if you already have high cholesterol or other risk factors for heart disease. The blood test for cholesterol measures:  "Bad"  cholesterol (LDL cholesterol). This is the main type of cholesterol that causes heart disease. The desired level for LDL is less than 100.  "Good" cholesterol (HDL cholesterol). This type helps to protect against heart disease by cleaning the arteries and carrying the LDL away. The desired level for HDL is 60 or higher.  Triglycerides. These are fats that the body can store or burn for energy. The desired number for triglycerides is lower than 150.  Total cholesterol. This is a measure of the total amount of cholesterol in your blood, including LDL cholesterol, HDL cholesterol, and triglycerides. A healthy number is less than 200. How is this treated? This condition is treated with diet changes, lifestyle changes, and medicines. Diet changes  This may include eating more whole grains, fruits, vegetables, nuts, and fish.  This may also include cutting back on red meat and foods that have a lot of added sugar. Lifestyle changes  Changes may include getting at least 40 minutes of aerobic exercise 3 times a week. Aerobic exercises include walking, biking, and swimming. Aerobic exercise along with a healthy diet can help you maintain a healthy weight.  Changes may also include quitting smoking. Medicines  Medicines are usually given if diet and lifestyle changes have failed to reduce your cholesterol to healthy levels.  Your health care provider may prescribe a statin medicine. Statin medicines have been shown to reduce cholesterol, which can reduce the risk of heart disease. Follow these instructions at home: Eating and drinking If told by your health care provider:  Eat chicken (without skin), fish, veal, shellfish, ground [place] breast, and round or loin cuts of red meat.  Do not eat fried foods or fatty meats, such as hot dogs and salami.  Eat plenty of fruits, such as apples.  Eat plenty of vegetables, such as broccoli, potatoes, and carrots.  Eat beans, peas, and lentils.  Eat  grains such as barley, rice, couscous, and bulgur wheat.  Eat pasta without cream sauces.  Use skim or nonfat milk, and eat low-fat or nonfat yogurt and cheeses.  Do not eat or drink whole milk, cream, ice cream, egg yolks, or hard cheeses.  Do not eat stick margarine or tub margarines that contain trans fats (also called partially hydrogenated oils).  Do not eat saturated tropical oils, such as coconut oil and palm oil.  Do not eat cakes, cookies, crackers, or other baked goods that contain trans fats.  General instructions  Exercise as directed by your health care provider. Increase your activity level with activities such as  gardening, walking, and taking the stairs.  Take over-the-counter and prescription medicines only as told by your health care provider.  Do not use any products that contain nicotine or tobacco, such as cigarettes and e-cigarettes. If you need help quitting, ask your health care provider.  Keep all follow-up visits as told by your health care provider. This is important. Contact a health care provider if:  You are struggling to maintain a healthy diet or weight.  You need help to start on an exercise program.  You need help to stop smoking. Get help right away if:  You have chest pain.  You have trouble breathing. This information is not intended to replace advice given to you by your health care provider. Make sure you discuss any questions you have with your health care provider. Document Released: 02/13/2005 Document Revised: 09/11/2015 Document Reviewed: 08/14/2015 Elsevier Interactive Patient Education  [employer]2019 Else[employer] Inc.

## 2018-08-08 LAB — CBC WITH DIFFERENTIAL/PLATELET
Absolute Monocytes: 400 cells/uL (ref 200–950)
Basophils Absolute: 28 cells/uL (ref 0–200)
Basophils Relative: 0.6 %
Eosinophils Absolute: 329 cells/uL (ref 15–500)
Eosinophils Relative: 7 %
HCT: 42.8 % (ref 38.5–50.0)
Hemoglobin: 13.8 g/dL (ref 13.2–17.1)
Lymphs Abs: 1857 cells/uL (ref 850–3900)
MCH: 28.9 pg (ref 27.0–33.0)
MCHC: 32.2 g/dL (ref 32.0–36.0)
MCV: 89.5 fL (ref 80.0–100.0)
MPV: 10.4 fL (ref 7.5–12.5)
Monocytes Relative: 8.5 %
Neutro Abs: 2087 cells/uL (ref 1500–7800)
Neutrophils Relative %: 44.4 %
Platelets: 254 10*3/uL (ref 140–400)
RBC: 4.78 10*6/uL (ref 4.20–5.80)
RDW: 12.5 % (ref 11.0–15.0)
Total Lymphocyte: 39.5 %
WBC: 4.7 10*3/uL (ref 3.8–10.8)

## 2018-08-08 LAB — COMPREHENSIVE METABOLIC PANEL
AG Ratio: 1.8 (calc) (ref 1.0–2.5)
ALT: 30 U/L (ref 9–46)
AST: 20 U/L (ref 10–40)
Albumin: 4.2 g/dL (ref 3.6–5.1)
Alkaline phosphatase (APISO): 59 U/L (ref 36–130)
BUN/Creatinine Ratio: 12 (calc) (ref 6–22)
BUN: 16 mg/dL (ref 7–25)
CO2: 27 mmol/L (ref 20–32)
Calcium: 9.4 mg/dL (ref 8.6–10.3)
Chloride: 106 mmol/L (ref 98–110)
Creat: 1.38 mg/dL — ABNORMAL HIGH (ref 0.60–1.35)
Globulin: 2.4 g/dL (calc) (ref 1.9–3.7)
Glucose, Bld: 84 mg/dL (ref 65–99)
Potassium: 5 mmol/L (ref 3.5–5.3)
Sodium: 140 mmol/L (ref 135–146)
Total Bilirubin: 0.5 mg/dL (ref 0.2–1.2)
Total Protein: 6.6 g/dL (ref 6.1–8.1)

## 2018-08-08 LAB — LIPID PANEL
Cholesterol: 214 mg/dL — ABNORMAL HIGH (ref <200)
HDL: 41 mg/dL (ref >=40)
LDL Cholesterol (Calc): 154 mg/dL (calc) — ABNORMAL HIGH
Non-HDL Cholesterol (Calc): 173 mg/dL (calc) — ABNORMAL HIGH (ref <130)
Total CHOL/HDL Ratio: 5.2 (calc) — ABNORMAL HIGH (ref <5.0)
Triglycerides: 85 mg/dL (ref <150)

## 2018-08-08 LAB — HIV ANTIBODY (ROUTINE TESTING W REFLEX): HIV 1&2 Ab, 4th Generation: NONREACTIVE

## 2019-02-22 IMAGING — US US ABDOMEN LIMITED
1 series · 14 of 25 positions shown · non-contrast
Comparison: None.

CLINICAL DATA: Elevated liver enzymes

EXAM:
ULTRASOUND ABDOMEN LIMITED RIGHT UPPER QUADRANT

[Series 1: us abdomen limited · 0.16mm/px · 14 of 39 slices shown]
[im 1/39]
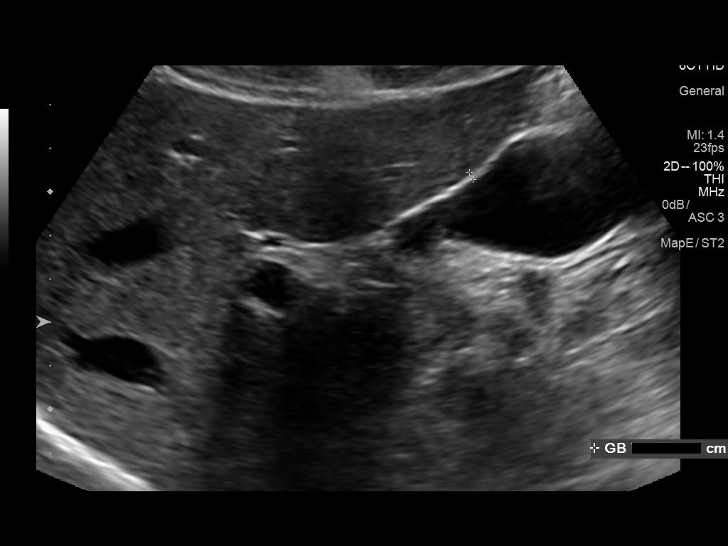
[im 4/39]
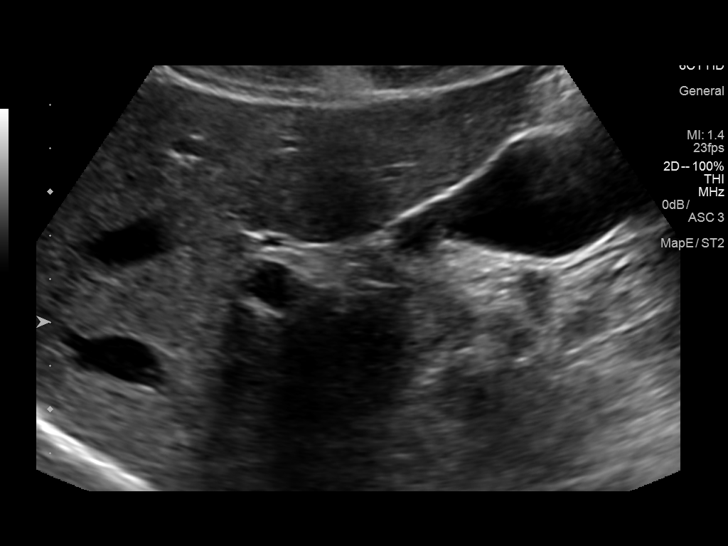
[im 7/39]
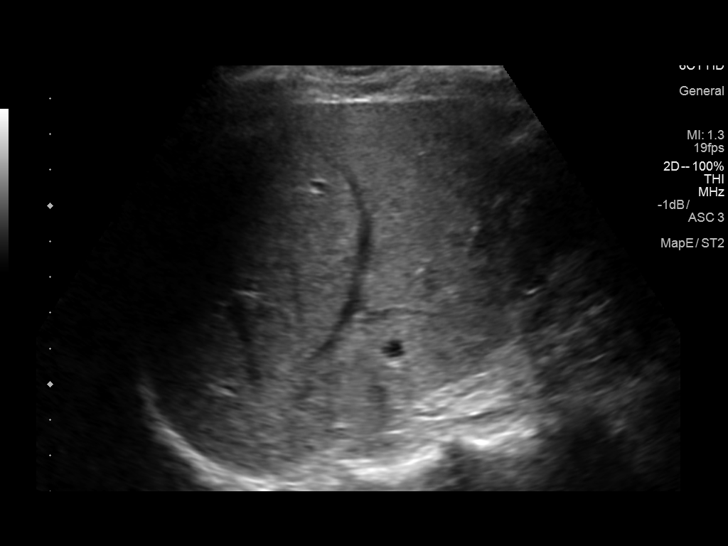
[im 10/39]
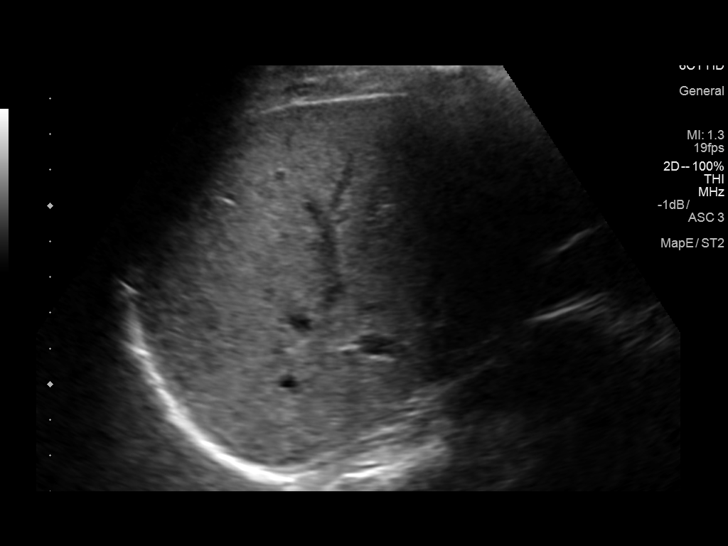
[im 13/39]
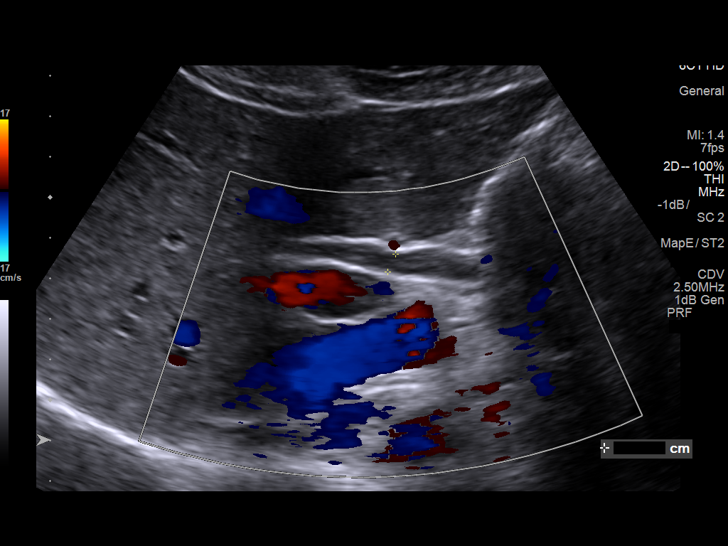
[im 15/39]
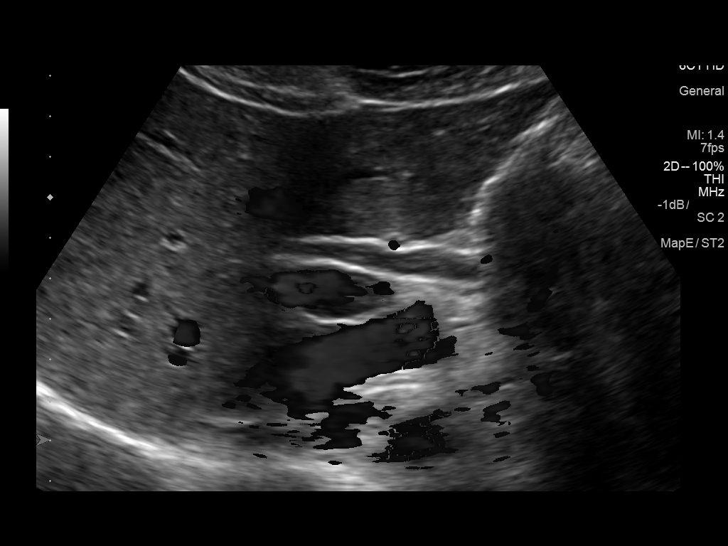
[im 18/39]
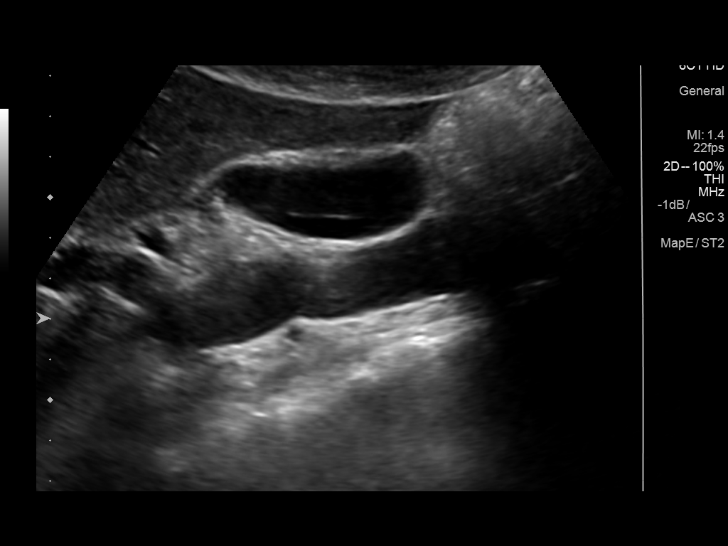
[im 21/39]
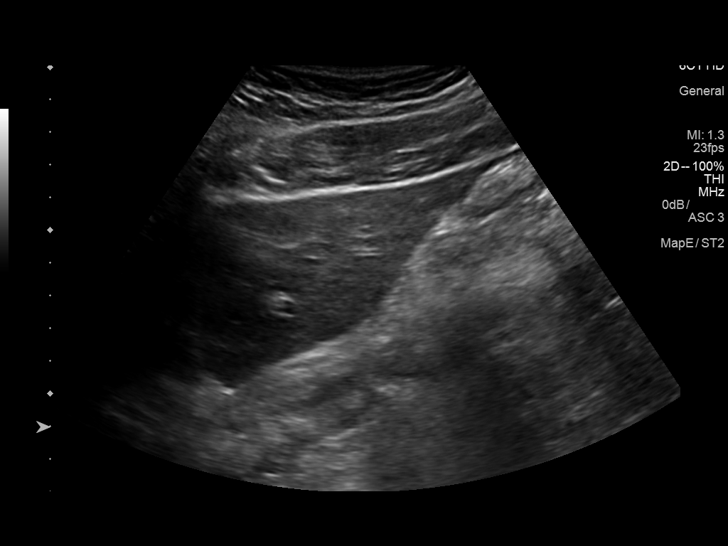
[im 24/39]
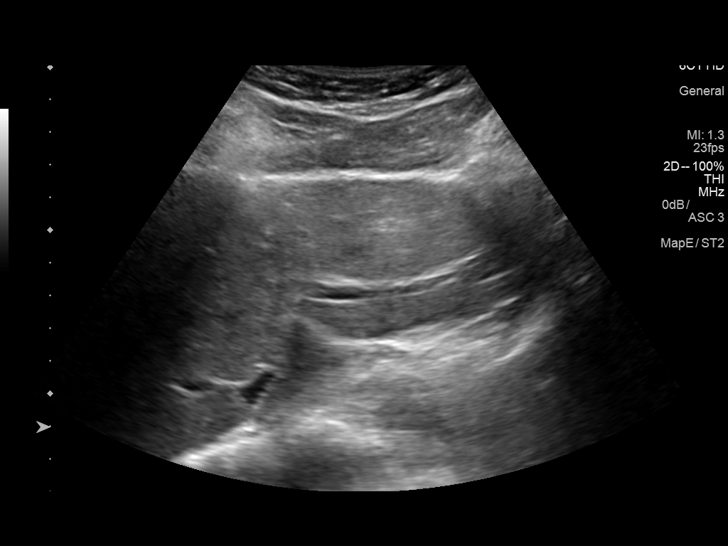
[im 26/39]
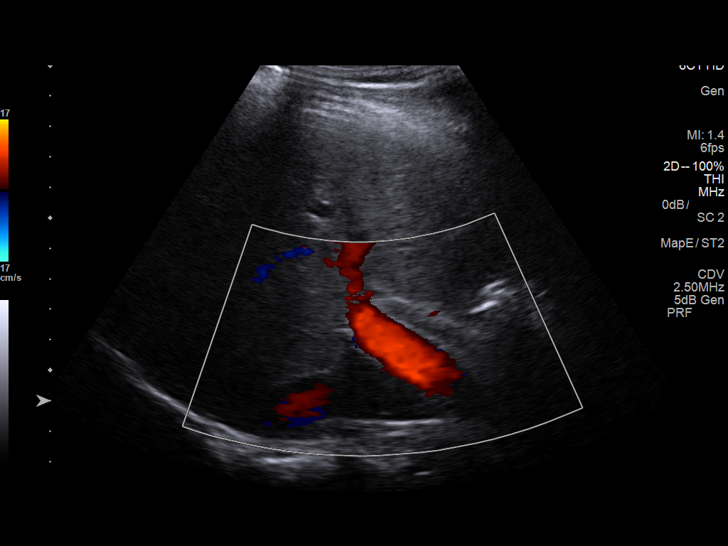
[im 29/39]
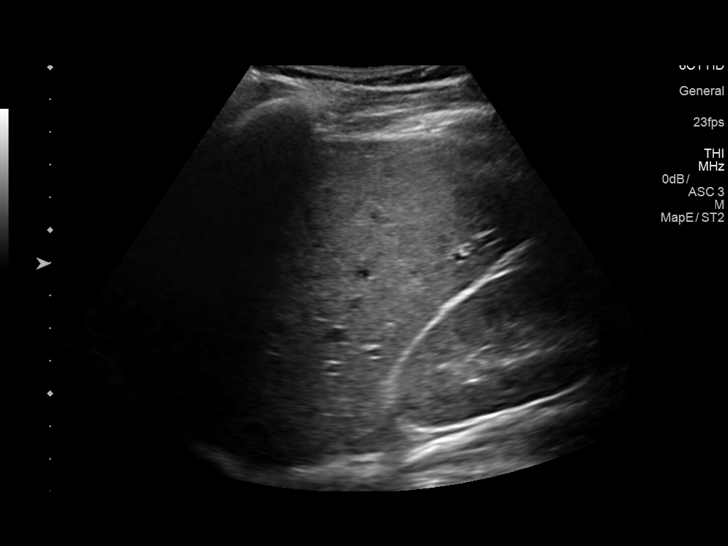
[im 32/39]
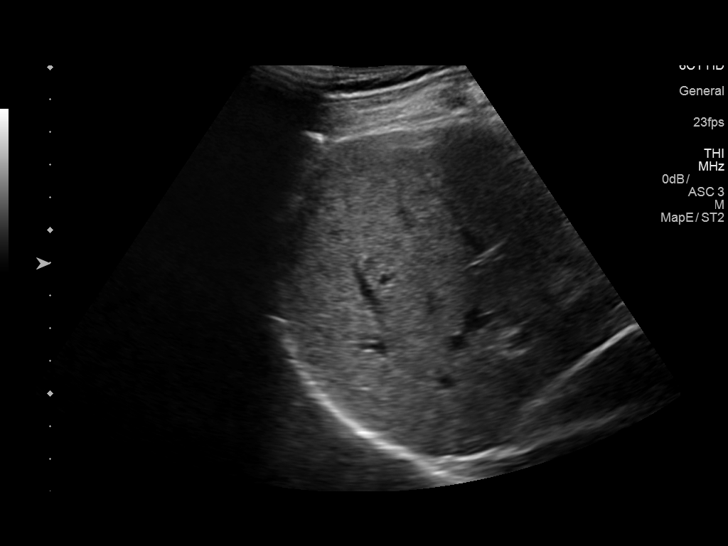
[im 35/39]
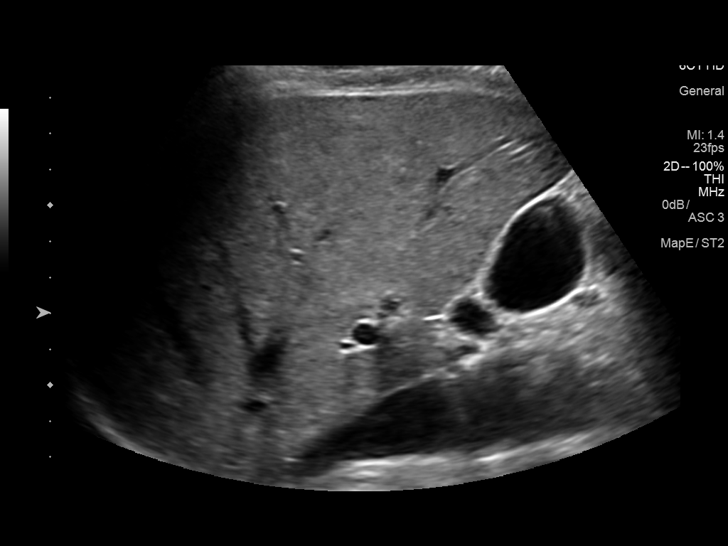
[im 39/39]
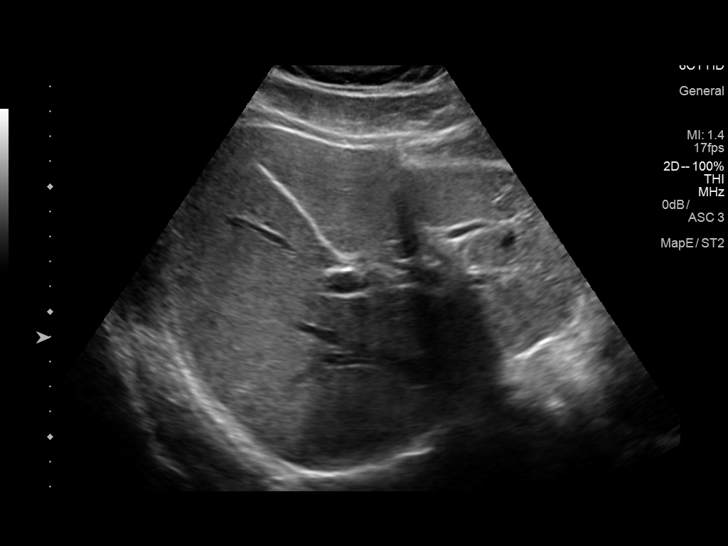

[14 of 25 positions shown; findings below may reference images not displayed]

FINDINGS: Gallbladder:

No gallstones or wall thickening visualized. There is no
pericholecystic fluid. No sonographic Murphy sign noted by
sonographer.

Common bile duct:

Diameter: 5 mm. No intrahepatic or extrahepatic biliary duct
dilatation.

Liver:

No focal lesion identified. Within normal limits in parenchymal
echogenicity. Portal vein is patent on color Doppler imaging with
normal direction of blood flow towards the liver.
IMPRESSION: Study within normal limits.

## 2019-07-29 ENCOUNTER — Other Ambulatory Visit: Payer: Self-pay

## 2019-07-29 ENCOUNTER — Encounter: Payer: Self-pay | Admitting: Allergy and Immunology

## 2019-07-29 ENCOUNTER — Ambulatory Visit: Payer: BC Managed Care – PPO | Admitting: Allergy and Immunology

## 2019-07-29 VITALS — BP 102/74 | HR 64 | Temp 97.2°F | Resp 16 | Ht 70.75 in | Wt 188.8 lb

## 2019-07-29 DIAGNOSIS — H1013 Acute atopic conjunctivitis, bilateral: Secondary | ICD-10-CM | POA: Diagnosis not present

## 2019-07-29 DIAGNOSIS — J0101 Acute recurrent maxillary sinusitis: Secondary | ICD-10-CM | POA: Insufficient documentation

## 2019-07-29 DIAGNOSIS — J3089 Other allergic rhinitis: Secondary | ICD-10-CM | POA: Diagnosis not present

## 2019-07-29 DIAGNOSIS — L500 Allergic urticaria: Secondary | ICD-10-CM

## 2019-07-29 DIAGNOSIS — H1010 Acute atopic conjunctivitis, unspecified eye: Secondary | ICD-10-CM | POA: Insufficient documentation

## 2019-07-29 HISTORY — DX: Allergic urticaria: L50.0

## 2019-07-29 MED ORDER — OLOPATADINE HCL 0.2 % OP SOLN: 1.0000 [drp] | Freq: Every day | OPHTHALMIC | 5 refills | Status: DC | PRN

## 2019-07-29 MED ORDER — XHANCE 93 MCG/ACT NA EXHU: 2.0000 "application " | INHALANT_SUSPENSION | Freq: Two times a day (BID) | NASAL | 5 refills | Status: DC

## 2019-07-29 MED ORDER — LEVOCETIRIZINE DIHYDROCHLORIDE 5 MG PO TABS: 5.0000 mg | ORAL_TABLET | Freq: Every evening | ORAL | 5 refills | Status: DC

## 2019-07-29 NOTE — Assessment & Plan Note (Addendum)
   Treatment plan as outlined above for allergic rhinitis.  For thick post nasal drainage, nasal congestion, and/or sinus pressure, add guaifenesin 1200 mg (Mucinex Maximum Strength) plus/minus pseudoephedrine 120 mg  twice daily as needed with adequate hydration as discussed. Pseudoephedrine is only to be used for short-term relief of nasal/sinus congestion. Long-term use is discouraged due to potential side effects.  While having symptoms consistent with sinusitis, nasal saline lavage (NeilMed) is recommended as needed and prior to medicated nasal sprays.

## 2019-07-29 NOTE — Progress Notes (Signed)
New Patient Note  RE: Kenneth Garcia MRN: [id number] DOB: 08/29/1975 Date of Office Visit: 07/29/2019  Referring provider: No ref. provider found Primary care provider: Alycia Rossetti, MD  Chief Complaint: Allergic Rhinitis , Conjunctivitis, Sinus Problem, and Urticaria   History of present illness: Kenneth Garcia is a 44 y.o. male presenting today for evaluation of rhinoconjunctivitis and a rash. He complains of nasal congestion, rhinorrhea, sneezing, thick postnasal drainage, nasal pruritus, ocular pruritus, and sinus pressure over the cheekbones.  He has experienced these nasal, sinus, and ocular allergy symptoms his "whole life."  These symptoms occur year-round but are most frequent and severe during the springtime, as well as summer and fall.  He currently attempts to control the symptoms with cetirizine.  He has tried various nasal sprays and eyedrops in the past.  Kenneth Garcia reports that over the past several years he has developed a rash on his arms and elsewhere when he is outdoors during pollen season.  The rash is erythematous, raised, and pruritic and he describes the it as looking like "little welts."  The rash is transient and leaves no residual pigmentation or bruising.  He does not experience concomitant angioedema, cardiopulmonary symptoms, and/or GI symptoms.  He has no complaint of recurrent fevers, unexpected weight loss, or drenching night sweats.  Assessment and plan: Seasonal and perennial allergic rhinitis  Aeroallergen avoidance measures have been discussed and provided in written form.  A prescription has been provided for levocetirizine(Xyzal), 5 mg daily as needed.  To avoid diminishing benefit with daily use (tachyphylaxis) of second generation antihistamine, consider alternating every few months between fexofenadine (Allegra) and levocetirizine (Xyzal).  A prescription has been provided for [facility], 2 actuations per nostril twice a day. Proper technique  has been discussed and demonstrated.  Nasal saline spray (i.e., Simply Saline) or nasal saline lavage (i.e., NeilMed) is recommended as needed and prior to medicated nasal sprays.  If allergen avoidance measures and medications fail to adequately relieve symptoms, aeroallergen immunotherapy will be considered.  Allergic conjunctivitis  Treatment plan as outlined above for allergic rhinitis.  A prescription has been provided for generic Pataday, one drop per eye daily as needed.  If insurance does not cover this medication, it may be purchased over-the-counter as Lawyer.  I have also recommended eye lubricant drops (i.e., Natural Tears) as needed.  Recurrent maxillary sinusitis  Treatment plan as outlined above for allergic rhinitis.  For thick post nasal drainage, nasal congestion, and/or sinus pressure, add guaifenesin 1200 mg (Mucinex Maximum Strength) plus/minus pseudoephedrine 120 mg  twice daily as needed with adequate hydration as discussed. Pseudoephedrine is only to be used for short-term relief of nasal/sinus congestion. Long-term use is discouraged due to potential side effects.  While having symptoms consistent with sinusitis, nasal saline lavage (NeilMed) is recommended as needed and prior to medicated nasal sprays.  Allergic urticaria The patient's history and skin test results suggest allergic urticaria secondary to pollen exposure. Skin tests to select food allergens were negative today. NSAIDs and emotional stress commonly exacerbate urticaria but are not the underlying etiology in this case. Physical urticarias are negative by history (i.e. pressure-induced, temperature, vibration, solar, etc.). History and lesions are not consistent with urticaria pigmentosa so I am not suspicious for mastocytosis. There are no concomitant symptoms concerning for anaphylaxis or constitutional symptoms worrisome for an underlying malignancy.   We will not order labs at this  time, however, if lesions recur, persist, progress, or change in character in the absence  of pollen exposure, we will assess potential etiologies with screening labs.  For symptom relief, patient is to take oral antihistamines as directed.  Levocetirizine has been prescribed (as above).    Should symptoms recur in the absence of aeroallergens/pollen exposure, a journal is to be kept recording any foods eaten, beverages consumed, medications taken within a 6 hour period prior to the onset of symptoms, as well as record activities being performed, and environmental conditions. For any symptoms concerning for anaphylaxis, 911 is to be called immediately.   Meds ordered this encounter  Medications  . levocetirizine (XYZAL) 5 MG tablet    Sig: Take 1 tablet (5 mg total) by mouth every evening.    Dispense:  30 tablet    Refill:  5  . Fluticasone Propionate (XHANCE) 93 MCG/ACT EXHU    Sig: Place 2 application into the nose in the morning and at bedtime.    Dispense:  16 mL    Refill:  5  . Olopatadine HCl (PATADAY) 0.2 % SOLN    Sig: Apply 1 drop to eye daily as needed.    Dispense:  2.5 mL    Refill:  5    Diagnostics: Environmental skin testing: Robust reactivity to grass pollen, weed pollen, ragweed pollen, tree pollen, and dust mite antigen. Food allergen skin testing: Negative despite a positive histamine control.    Physical examination: Blood pressure 102/74, pulse 64, temperature (!) 97.2 F (36.2 C), temperature source Temporal, resp. rate 16, height 5' 10.75" (1.797 m), weight 188 lb 12.8 oz (85.6 kg), SpO2 98 %.  General: Alert, interactive, in no acute distress. HEENT: TMs pearly gray, turbinates moderately edematous with thick discharge, post-pharynx erythematous. Neck: Supple without lymphadenopathy. Lungs: Clear to auscultation without wheezing, rhonchi or rales. CV: Normal S1, S2 without murmurs. Abdomen: Nondistended, nontender. Skin: Warm and dry, without lesions or  rashes. Extremities:  No clubbing, cyanosis or edema. Neuro:   Grossly intact.  Review of systems:  Review of systems negative except as noted in HPI / PMHx or noted below: Review of Systems  Constitutional: Negative.   HENT: Negative.   Eyes: Negative.   Respiratory: Negative.   Cardiovascular: Negative.   Gastrointestinal: Negative.   Genitourinary: Negative.   Musculoskeletal: Negative.   Skin: Negative.   Neurological: Negative.   Endo/Heme/Allergies: Negative.   Psychiatric/Behavioral: Negative.     Past medical history:  Past Medical History:  Diagnosis Date  . 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  . Allergy    pollen    Past surgical history:  Past Surgical History:  Procedure Laterality Date  . EXCISION / CURETTAGE BONE CYST PHALANGES OF FOOT N/A    both left and right implants in both feet to correct flat feet  . VASECTOMY  2019    Family history: Family History  Problem Relation Age of Onset  . Allergic rhinitis Mother   . Hyperlipidemia Father   . Hypertension Father   . Allergic rhinitis Father   . Cancer Maternal Aunt   . COPD Maternal Aunt   . Cancer Maternal Uncle   . Diabetes Maternal Grandfather   . Heart disease Maternal Grandfather     Social history: Social History   Socioeconomic History  . Marital status: Married    Spouse name: Not on file  . Number of children: Not on file  . Years of education: Not on file  . Highest education level: Not on file  Occupational History  . Not on file  Tobacco Use  .  Smoking status: Never Smoker  . Smokeless tobacco: Never Used  Substance and Sexual Activity  . Alcohol use: No  . Drug use: No  . Sexual activity: Yes  Other Topics Concern  . Not on file  Social History Narrative  . Not on file   Social Determinants of Health   Financial Resource Strain:   . Difficulty of Paying Living Expenses:   Food Insecurity:   . Worried About Programme researcher, broadcasting/film/video in the Last Year:   . Barista in  the Last Year:   Transportation Needs:   . Freight forwarder (Medical):   Marland Kitchen Lack of Transportation (Non-Medical):   Physical Activity:   . Days of Exercise per Week:   . Minutes of Exercise per Session:   Stress:   . Feeling of Stress :   Social Connections:   . Frequency of Communication with Friends and Family:   . Frequency of Social Gatherings with Friends and Family:   . Attends Religious Services:   . Active Member of Clubs or Organizations:   . Attends Banker Meetings:   Marland Kitchen Marital Status:   Intimate Partner Violence:   . Fear of Current or Ex-Partner:   . Emotionally Abused:   Marland Kitchen Physically Abused:   . Sexually Abused:     Environmental History: The patient lives in a 43-year-old house with carpeting in the bedroom, gas heat, and central air.  There is no known mold/water damage in the home.  There are no pets in the home.  He is a non-smoker.  Current Outpatient Medications  Medication Sig Dispense Refill  . B Complex Vitamins (VITAMIN B COMPLEX PO) Take by mouth.    . Multiple Vitamin (MULTIVITAMIN) tablet Take 4 tablets by mouth daily. Garden of life vitamin code men    . Omega-3 Fatty Acids (FISH OIL PO) Take 1 capsule by mouth daily.    . Fluticasone Propionate (XHANCE) 93 MCG/ACT EXHU Place 2 application into the nose in the morning and at bedtime. 16 mL 5  . levocetirizine (XYZAL) 5 MG tablet Take 1 tablet (5 mg total) by mouth every evening. 30 tablet 5  . Olopatadine HCl (PATADAY) 0.2 % SOLN Apply 1 drop to eye daily as needed. 2.5 mL 5   No current facility-administered medications for this visit.    Known medication allergies: No Known Allergies  I appreciate the opportunity to take part in Cresencio's care. Please do not hesitate to contact me with questions.  Sincerely,   R. Jorene Guest, MD

## 2019-07-29 NOTE — Assessment & Plan Note (Signed)
The patient's history and skin test results suggest allergic urticaria secondary to pollen exposure. Skin tests to select food allergens were negative today. NSAIDs and emotional stress commonly exacerbate urticaria but are not the underlying etiology in this case. Physical urticarias are negative by history (i.e. pressure-induced, temperature, vibration, solar, etc.). History and lesions are not consistent with urticaria pigmentosa so I am not suspicious for mastocytosis. There are no concomitant symptoms concerning for anaphylaxis or constitutional symptoms worrisome for an underlying malignancy.   We will not order labs at this time, however, if lesions recur, persist, progress, or change in character in the absence of pollen exposure, we will assess potential etiologies with screening labs.  For symptom relief, patient is to take oral antihistamines as directed.  Levocetirizine has been prescribed (as above).    Should symptoms recur in the absence of aeroallergens/pollen exposure, a journal is to be kept recording any foods eaten, beverages consumed, medications taken within a 6 hour period prior to the onset of symptoms, as well as record activities being performed, and environmental conditions. For any symptoms concerning for anaphylaxis, 911 is to be called immediately.

## 2019-07-29 NOTE — Assessment & Plan Note (Signed)
   Aeroallergen avoidance measures have been discussed and provided in written form.  A prescription has been provided for levocetirizine(Xyzal), 5 mg daily as needed.  To avoid diminishing benefit with daily use (tachyphylaxis) of second generation antihistamine, consider alternating every few months between fexofenadine (Allegra) and levocetirizine (Xyzal).  A prescription has been provided for Xhance, 2 actuations per nostril twice a day. Proper technique has been discussed and demonstrated.  Nasal saline spray (i.e., Simply Saline) or nasal saline lavage (i.e., NeilMed) is recommended as needed and prior to medicated nasal sprays.  If allergen avoidance measures and medications fail to adequately relieve symptoms, aeroallergen immunotherapy will be considered. 

## 2019-07-29 NOTE — Assessment & Plan Note (Signed)
   Treatment plan as outlined above for allergic rhinitis.  A prescription has been provided for generic Pataday, one drop per eye daily as needed.  If insurance does not cover this medication, it may be purchased over-the-counter as Pataday Extra Strength.  I have also recommended eye lubricant drops (i.e., Natural Tears) as needed. 

## 2019-07-29 NOTE — Patient Instructions (Addendum)
Seasonal and perennial allergic rhinitis  Aeroallergen avoidance measures have been discussed and provided in written form.  A prescription has been provided for levocetirizine(Xyzal), 5 mg daily as needed.  To avoid diminishing benefit with daily use (tachyphylaxis) of second generation antihistamine, consider alternating every few months between fexofenadine (Allegra) and levocetirizine (Xyzal).  A prescription has been provided for [facility], 2 actuations per nostril twice a day. Proper technique has been discussed and demonstrated.  Nasal saline spray (i.e., Simply Saline) or nasal saline lavage (i.e., NeilMed) is recommended as needed and prior to medicated nasal sprays.  If allergen avoidance measures and medications fail to adequately relieve symptoms, aeroallergen immunotherapy will be considered.  Allergic conjunctivitis  Treatment plan as outlined above for allergic rhinitis.  A prescription has been provided for generic Pataday, one drop per eye daily as needed.  If insurance does not cover this medication, it may be purchased over-the-counter as Lawyer.  I have also recommended eye lubricant drops (i.e., Natural Tears) as needed.  Recurrent maxillary sinusitis  Treatment plan as outlined above for allergic rhinitis.  For thick post nasal drainage, nasal congestion, and/or sinus pressure, add guaifenesin 1200 mg (Mucinex Maximum Strength) plus/minus pseudoephedrine 120 mg  twice daily as needed with adequate hydration as discussed. Pseudoephedrine is only to be used for short-term relief of nasal/sinus congestion. Long-term use is discouraged due to potential side effects.  While having symptoms consistent with sinusitis, nasal saline lavage (NeilMed) is recommended as needed and prior to medicated nasal sprays.  Allergic urticaria The patient's history and skin test results suggest allergic urticaria secondary to pollen exposure. Skin tests to select food  allergens were negative today. NSAIDs and emotional stress commonly exacerbate urticaria but are not the underlying etiology in this case. Physical urticarias are negative by history (i.e. pressure-induced, temperature, vibration, solar, etc.). History and lesions are not consistent with urticaria pigmentosa so I am not suspicious for mastocytosis. There are no concomitant symptoms concerning for anaphylaxis or constitutional symptoms worrisome for an underlying malignancy.   We will not order labs at this time, however, if lesions recur, persist, progress, or change in character in the absence of pollen exposure, we will assess potential etiologies with screening labs.  For symptom relief, patient is to take oral antihistamines as directed.  Levocetirizine has been prescribed (as above).    Should symptoms recur in the absence of aeroallergens/pollen exposure, a journal is to be kept recording any foods eaten, beverages consumed, medications taken within a 6 hour period prior to the onset of symptoms, as well as record activities being performed, and environmental conditions. For any symptoms concerning for anaphylaxis, 911 is to be called immediately.   Return in about 3 months (around 10/29/2019).  Control of House Dust Mite Allergen  House dust mites play a major role in allergic asthma and rhinitis.  They occur in environments with high humidity wherever human skin, the food for dust mites is found. High levels have been detected in dust obtained from mattresses, pillows, carpets, upholstered furniture, bed covers, clothes and soft toys.  The principal allergen of the house dust mite is found in its feces.  A gram of dust may contain 1,000 mites and 250,000 fecal particles.  Mite antigen is easily measured in the air during house cleaning activities.    1. Encase mattresses, including the box spring, and pillow, in an air tight cover.  Seal the zipper end of the encased mattresses with wide  adhesive tape. 2. Wash  the bedding in water of 130 degrees Farenheit weekly.  Avoid cotton comforters/quilts and flannel bedding: the most ideal bed covering is the dacron comforter. 3. Remove all upholstered furniture from the bedroom. 4. Remove carpets, carpet padding, rugs, and non-washable window drapes from the bedroom.  Wash drapes weekly or use plastic window coverings. 5. Remove all non-washable stuffed toys from the bedroom.  Wash stuffed toys weekly. 6. Have the room cleaned frequently with a vacuum cleaner and a damp dust-mop.  The patient should not be in a room which is being cleaned and should wait 1 hour after cleaning before going into the room. 7. Close and seal all heating outlets in the bedroom.  Otherwise, the room will become filled with dust-laden air.  An electric heater can be used to heat the room. 8. Reduce indoor humidity to less than 50%.  Do not use a humidifier.  Reducing Pollen Exposure  The American Academy of Allergy, Asthma and Immunology suggests the following steps to reduce your exposure to pollen during allergy seasons.    1. Do not hang sheets or clothing out to dry; pollen may collect on these items. 2. Do not mow lawns or spend time around freshly cut grass; mowing stirs up pollen. 3. Keep windows closed at night.  Keep car windows closed while driving. 4. Minimize morning activities outdoors, a time when pollen counts are usually at their highest. 5. Stay indoors as much as possible when pollen counts or humidity is high and on windy days when pollen tends to remain in the air longer. 6. Use air conditioning when possible.  Many air conditioners have filters that trap the pollen spores. 7. Use a HEPA room air filter to remove pollen form the indoor air you breathe.

## 2019-08-13 ENCOUNTER — Encounter: Payer: BC Managed Care – PPO | Admitting: Family Medicine

## 2020-02-08 ENCOUNTER — Other Ambulatory Visit: Payer: Self-pay | Admitting: Allergy and Immunology

## 2020-09-28 ENCOUNTER — Telehealth: Payer: BC Managed Care – PPO

## 2020-09-28 NOTE — Telephone Encounter (Signed)
I called the patient because we received a refill request for xyzal. Patient was last seen in June of 2021 by Dr.Bobbitt. He goes to a different allergist office and does not need a refill.

## 2021-09-24 LAB — GLUCOSE, POCT (MANUAL RESULT ENTRY): POC Glucose: 127 mg/dl — AB (ref 70–99)

## 2021-09-24 LAB — POCT GLYCOSYLATED HEMOGLOBIN (HGB A1C): Hemoglobin-A1c: 5.7

## 2022-04-18 ENCOUNTER — Other Ambulatory Visit (HOSPITAL_COMMUNITY): Payer: Self-pay | Admitting: Physician Assistant

## 2022-04-18 DIAGNOSIS — M545 Low back pain, unspecified: Secondary | ICD-10-CM

## 2022-04-21 ENCOUNTER — Ambulatory Visit (HOSPITAL_COMMUNITY)
Admission: RE | Admit: 2022-04-21 | Discharge: 2022-04-21 | Disposition: A | Discharge status: Home / Self Care | Payer: No Typology Code available for payment source | Source: Ambulatory Visit | Attending: Physician Assistant | Admitting: Physician Assistant

## 2022-04-21 DIAGNOSIS — M545 Low back pain, unspecified: Secondary | ICD-10-CM | POA: Diagnosis present

## 2023-03-01 ENCOUNTER — Ambulatory Visit (INDEPENDENT_AMBULATORY_CARE_PROVIDER_SITE_OTHER): Payer: 59

## 2023-03-01 ENCOUNTER — Encounter: Payer: Self-pay | Admitting: Podiatry

## 2023-03-01 ENCOUNTER — Ambulatory Visit: Payer: Self-pay | Admitting: Podiatry

## 2023-03-01 VITALS — Ht 70.75 in | Wt 188.0 lb

## 2023-03-01 DIAGNOSIS — M2141 Flat foot [pes planus] (acquired), right foot: Secondary | ICD-10-CM

## 2023-03-01 DIAGNOSIS — M25571 Pain in right ankle and joints of right foot: Secondary | ICD-10-CM

## 2023-03-01 DIAGNOSIS — M778 Other enthesopathies, not elsewhere classified: Secondary | ICD-10-CM

## 2023-03-01 DIAGNOSIS — M25471 Effusion, right ankle: Secondary | ICD-10-CM

## 2023-03-01 DIAGNOSIS — T849XXA Unspecified complication of internal orthopedic prosthetic device, implant and graft, initial encounter: Secondary | ICD-10-CM

## 2023-03-01 DIAGNOSIS — M2142 Flat foot [pes planus] (acquired), left foot: Secondary | ICD-10-CM

## 2023-03-01 NOTE — Progress Notes (Signed)
 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 I served in the [employer] (active-duty) from 2002-2006. I contend that military service aggravated my pes planus, as I experience chronic foot pain when standing for even short periods of time. Notably, my right ankle is often swollen. In 2013, Dr. Lynwood Bud (of [facility]) concluded from an MRI that a 4mm loose body was in the posterior ankle recess was likely a fragment of the cylindrical prosthesis.    Discussed the use of AI scribe software for clinical note transcription with the patient, who gave verbal consent to proceed.  History of Present Illness   The patient, with a history of foot issues since adolescence, presents with worsening pain and swelling in both feet, more pronounced in the right foot. 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 Over the years, the patient has noticed a progressive worsening of symptoms, particularly in the right foot, which is persistently swollen around the ankle. The pain experienced is sometimes sharp, causing the patient to buckle, and at other times, it is a dull ache. The patient reports that the pain is more severe and extensive in the right foot, with no noticeable swelling in the left foot.  The patient had an MRI over a decade ago, which revealed a degrading subtalar implant in the right foot. He has also had x-rays done through the [facility], which led to a referral for custom molded orthoses. However, the patient did not find these inserts helpful and discontinued his use due to increased pain. The patient has not received any injections or other treatments for the foot pain.  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 He also reports lower back issues for which he takes Methocarbamol at night. The patient served in the [employer] until 2006, and he believes this service may have contributed to the worsening of his foot condition.          Objective:    Physical Exam   MUSCULOSKELETAL: Right foot exhibits swelling around the ankle and medial and lateral subtalar joints, tenderness with range of motion, warmth, and edema. Left foot shows no pain or tenderness upon palpation of the sinus tarsi and ankle joint. Pes planus bilateral. Pulses palpable, normal neurovascular exam.            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 Right foot: large posterior subtalar joint, ankle and anterior ankle joint effusion (03/01/2023)  Left Foot X-ray: Flexible pes planovagus deformity (02/10/2022)      Assessment:   1. Capsulitis of foot   2. Pes planus of both feet   3. 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 Complication of internal orthopedic prosthetic device, implant,  or graft [facility])      Plan:  Patient was evaluated and treated and all questions answered.  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 A  previous MRI in 2013 revealed a loose body in the posterior ankle recess and a degrading cylindrical prosthesis in the sinus tarsi. Recent X-rays show a large posterior subtalar joint and anterior ankle joint effusion on the right foot. We will order a new MRI to evaluate the current state of the prosthesis and surrounding tissue. Depending on the MRI results, a potential future CT scan may be necessary to evaluate bony structures. Surgical intervention, including flat foot reconstruction, removal of the prosthesis, and possible joint fusion, will be considered pending the results of the MRI and potential CT scan. A follow-up appointment is scheduled for three weeks after the MRI to discuss results and potential treatment options.          Return for call to schedule appt 3 weeks after MRI to review.

## 2023-03-01 NOTE — Patient Instructions (Signed)

## 2023-03-06 ENCOUNTER — Encounter: Payer: Self-pay | Admitting: Podiatry

## 2023-03-09 ENCOUNTER — Ambulatory Visit
Admission: RE | Admit: 2023-03-09 | Discharge: 2023-03-09 | Disposition: A | Discharge status: Home / Self Care | Payer: 59 | Source: Ambulatory Visit | Attending: Podiatry | Admitting: Podiatry

## 2023-03-09 DIAGNOSIS — M25471 Effusion, right ankle: Secondary | ICD-10-CM

## 2023-03-09 DIAGNOSIS — M25571 Pain in right ankle and joints of right foot: Secondary | ICD-10-CM

## 2023-03-09 MED ORDER — GADOPICLENOL 0.5 MMOL/ML IV SOLN
8.0000 mL | Freq: Once | INTRAVENOUS | Status: AC | PRN
  Administered 2023-03-09: 8 mL via INTRAVENOUS

## 2023-03-29 ENCOUNTER — Ambulatory Visit: Payer: 59 | Admitting: Podiatry

## 2023-03-29 DIAGNOSIS — M25471 Effusion, right ankle: Secondary | ICD-10-CM

## 2023-03-29 DIAGNOSIS — M65871 Other synovitis and tenosynovitis, right ankle and foot: Secondary | ICD-10-CM | POA: Diagnosis not present

## 2023-03-29 DIAGNOSIS — M25571 Pain in right ankle and joints of right foot: Secondary | ICD-10-CM

## 2023-03-29 DIAGNOSIS — M2141 Flat foot [pes planus] (acquired), right foot: Secondary | ICD-10-CM

## 2023-03-29 DIAGNOSIS — M2142 Flat foot [pes planus] (acquired), left foot: Secondary | ICD-10-CM

## 2023-03-30 NOTE — Progress Notes (Signed)
Subjective:  Patient ID: Kenneth Garcia, male    DOB: [birth year],  MRN: [id number]  Chief Complaint  Chief Complaint  Patient presents with   Results    MRI results right, still having some pain and swelling              He returns for follow-up he completed the MRI symptoms are relatively unchanged         Objective:    Physical Exam   MUSCULOSKELETAL: Right foot exhibits swelling around the ankle and medial and lateral subtalar joints, tenderness with range of motion, warmth, and edema. Left foot shows no pain or tenderness upon palpation of the sinus tarsi and ankle joint. Pes planus bilateral. Pulses palpable, normal neurovascular exam.            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 Right foot: large posterior subtalar joint, ankle and anterior ankle joint effusion (03/01/2023)  Left Foot X-ray: Flexible pes planovagus deformity (02/10/2022)   Study Result  Narrative & Impression  CLINICAL DATA:  Right foot and ankle pain since 2002.   EXAM: MRI OF THE RIGHT ANKLE WITHOUT AND WITH CONTRAST   TECHNIQUE: Multiplanar, multisequence MR imaging of the ankle was performed before and after the administration of intravenous contrast.   CONTRAST:  8 mL Vueway, 2 mL wasted   COMPARISON:  Foot x-ray 03/01/2023   FINDINGS: TENDONS   Peroneal: Peroneal longus tendon intact. Peroneal brevis intact.   Posteromedial: Posterior tibial tendon intact. Flexor hallucis longus  tendon intact. Flexor digitorum longus tendon intact.   Anterior: Tibialis anterior tendon intact. Extensor hallucis longus tendon intact Extensor digitorum longus tendon intact.   Achilles:  Intact.   Plantar Fascia: Intact.   LIGAMENTS   Lateral: Anterior talofibular ligament intact. Calcaneofibular ligament intact. Posterior talofibular ligament intact. Anterior and posterior tibiofibular ligaments intact.   Medial: Deltoid ligament intact. Spring ligament intact.   CARTILAGE   Ankle Joint: Large ankle joint effusion with mild synovitis. 5.5 mm loose body along the anterior to the tibiotalar joint. Mild partial-thickness cartilage loss of the tibiotalar joint.   Subtalar Joints/Sinus Tarsi: Normal subtalar joints. No subtalar joint effusion. Normal sinus tarsi.   Bones: No aggressive osseous lesion. No fracture or dislocation.   Soft Tissue: No fluid collection or hematoma. Muscles are normal without edema or atrophy. Tarsal tunnel is normal.   IMPRESSION: IMPRESSION 1. Large ankle joint effusion with mild synovitis. 5.5 mm loose body along the anterior to the tibiotalar joint. 2. Mild partial-thickness cartilage loss of the tibiotalar joint.     Electronically Signed   By: Elige Ko M.D.   On: 03/12/2023 06:13       Assessment:   Encounter Diagnoses  Name Primary?   Other synovitis and tenosynovitis, right ankle and foot Yes   Sinus tarsi syndrome of right ankle    Pes planus of both feet    Ankle effusion, right       Plan:  Patient was evaluated and treated and all questions answered.  Assessment and Plan    We reviewed the results of  his MRI and discussed his progression and symptoms.  We discussed that the MRI clearly shows residual foreign body present in the sinus tarsi and subtalar joint anteriorly, there does appear to be some posterior in the ankle capsule and the anterior ankle capsule with significant effusions.  I discussed with him that  removal of these areas of the foreign body would be recommended.  I do think that these may be able to be addressed arthroscopically with excision of the foreign body and debridement of the synovitic portions of both the ankle and subtalar joint.  I recommended proceeding with this surgery as he has failed all nonoperative treatment options at this point and this continues to progress and so far has mild to moderate degenerative changes noted in the subtalar joint on the MRI.  Long-term may still need flatfoot reconstruction but would start with removal of the body and debridement to see how this alleviates his symptoms and potentially orthotics following this.  We discussed the risk benefits and potential complications.  We also discussed the risk and need for open procedure converting from arthroscopic.  All questions addressed.  Informed consent signed reviewed.  Surgery will be scheduled mutually agreeable date.    Surgical plan:  Procedure: -Right ankle and subtalar joint arthroscopies with excisions of loose body  Location: -[facility]  Anesthesia plan: -General With block  Postoperative pain plan: - Tylenol 1000 mg every 6 hours, ibuprofen 600 mg every 6 hours, gabapentin 300 mg every 8 hours x5 days, oxycodone 5 mg 1-2 tabs every 6 hours only as needed  DVT prophylaxis: -None required  WB Restrictions / DME needs: -WBAT postop in CAM boot

## 2023-08-27 ENCOUNTER — Ambulatory Visit (INDEPENDENT_AMBULATORY_CARE_PROVIDER_SITE_OTHER): Admitting: Podiatry

## 2023-08-27 ENCOUNTER — Encounter: Payer: Self-pay | Admitting: Podiatry

## 2023-08-27 DIAGNOSIS — M65871 Other synovitis and tenosynovitis, right ankle and foot: Secondary | ICD-10-CM

## 2023-08-27 DIAGNOSIS — M25571 Pain in right ankle and joints of right foot: Secondary | ICD-10-CM | POA: Diagnosis not present

## 2023-08-27 NOTE — Progress Notes (Signed)
 Subjective:  Patient ID: Kenneth Garcia, male    DOB: [birth year],  MRN: [id number]  Chief Complaint  Chief Complaint  Patient presents with   Foot Pain    I want to schedule surgery.  I've been authorized for [facility].              He returns for follow-up he is ready to proceed with surgery, has approval and his symptoms are relatively unchanged         Objective:    Physical Exam   MUSCULOSKELETAL: Right foot exhibits swelling around the ankle and medial and lateral subtalar joints, tenderness with range of motion, warmth, and edema. Left foot shows no pain or tenderness upon palpation of the sinus tarsi and ankle joint. Pes planus bilateral. Pulses palpable, normal neurovascular exam.            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 Right foot: large posterior subtalar joint, ankle and anterior ankle joint effusion (03/01/2023)  Left Foot X-ray: Flexible pes planovagus deformity (02/10/2022)   Study Result  Narrative & Impression  CLINICAL DATA:  Right foot and ankle pain since 2002.   EXAM: MRI OF THE RIGHT ANKLE WITHOUT AND WITH CONTRAST   TECHNIQUE: Multiplanar, multisequence MR imaging of the ankle was performed before and after the administration of intravenous contrast.   CONTRAST:  8 mL Vueway , 2 mL wasted   COMPARISON:  Foot x-ray 03/01/2023   FINDINGS: TENDONS   Peroneal: Peroneal longus tendon intact. Peroneal brevis intact.    Posteromedial: Posterior tibial tendon intact. Flexor hallucis longus tendon intact. Flexor digitorum longus tendon intact.   Anterior: Tibialis anterior tendon intact. Extensor hallucis longus tendon intact Extensor digitorum longus tendon intact.   Achilles:  Intact.   Plantar Fascia: Intact.   LIGAMENTS   Lateral: Anterior talofibular ligament intact. Calcaneofibular ligament intact. Posterior talofibular ligament intact. Anterior and posterior tibiofibular ligaments intact.   Medial: Deltoid ligament intact. Spring ligament intact.   CARTILAGE   Ankle Joint: Large ankle joint effusion with mild synovitis. 5.5 mm loose body along the anterior to the tibiotalar joint. Mild partial-thickness cartilage loss of the tibiotalar joint.   Subtalar Joints/Sinus Tarsi: Normal subtalar joints. No subtalar joint effusion. Normal sinus tarsi.   Bones: No aggressive osseous lesion. No fracture or dislocation.   Soft Tissue: No fluid collection or hematoma. Muscles are normal without edema or atrophy. Tarsal tunnel is normal.   IMPRESSION: IMPRESSION 1. Large ankle joint effusion with mild synovitis. 5.5 mm loose body along the anterior to the tibiotalar joint. 2. Mild partial-thickness cartilage loss of the tibiotalar joint.     Electronically Signed   By: Julaine Blanch M.D.   On: 03/12/2023 06:13       Assessment:   Encounter Diagnoses  Name Primary?   Other synovitis and tenosynovitis, right ankle and foot Yes   Sinus tarsi syndrome of right ankle       Plan:  Patient was evaluated and treated and all questions answered.  Assessment and Plan    We again reviewed the results of his  MRI and discussed his progression and symptoms.  We discussed that the MRI clearly shows residual foreign body present in the sinus tarsi and subtalar joint anteriorly, there does appear to be some posterior in the ankle capsule and the anterior ankle capsule with significant effusions.   Discussed attempted arthroscopic effusion discussed that if unable to get the lesions arthroscopically that open removal may be necessary which he understands.  Also discussed long-term that surgical reconstruction with flatfoot reconstruction may be necessary as well.  Ideally if we have successful excision and debridement should be able to allow him to weight-bear in a boot and begin therapy and transition out of the boot to a brace and shoe after 1 month.  All questions addressed.  We discussed all risk benefits and potential complications include not limited to  pain, swelling, infection, scar, numbness which may be temporary or permanent, chronic pain, stiffness, nerve pain or damage, wound healing problems.  He understands and wishes to proceed.     Surgical plan:  Procedure: -Right ankle and subtalar joint arthroscopies with excisions of loose body  Location: -[facility]  Anesthesia plan: -General With block  Postoperative pain plan: - Tylenol 1000 mg every 6 hours, ibuprofen 600 mg every 6 hours, gabapentin 300 mg every 8 hours x5 days, oxycodone 5 mg 1-2 tabs every 6 hours only as needed  DVT prophylaxis: -None required  WB Restrictions / DME needs: -WBAT postop in CAM boot

## 2023-11-08 ENCOUNTER — Telehealth: Payer: Self-pay | Admitting: Podiatry

## 2023-11-08 NOTE — Telephone Encounter (Signed)
 DOS- 12/07/2023  ARTHROSCOPY, ANKLE WITH REMOVAL OF LOOSE/FOREIGN BODY RT- 29894 ARTHROSCOPY SUBTALAR JOINT WITH REMOVAL OF LOOSE/FOREIGN BODY RT- 29906  [facility] REFERR[facility] ISSUE DATE- 08/15/2023 [employer] EFFECTIVE DATE- 02/28/2023  [employer] DEDUCTIBLE- $1250 REMAINING- $0 OOP- $4890 REMAINING- $2613.40 FAMILY DEDUCTIBLE- $3750 REMAINING- $1250 FAMILY OOP- $85329 REMAINING-$7615.34 COINSURANCE- 40%  PER REFERRAL RECEIVED FROM [facility], THE AUTHORIZATION COVERS SERVICES LIKE: PROCEDURES PREFORMED BY ORTHOPEDIC SURGERY PROVIDER RELEVANT TO THE REFERRED CONDITION INCLUDING BUT NOT LIMITED TO: ARTHROSCOPIES AND ARTHROPLASTIES. DOCUMENTATION ATTACHED TO SURGERY CONSENT PACKET.  PER [facility] WEBSITE, NO PRIOR AUTHS ARE REQUIRED THROUGH [employer]ETNA FOR CPT CODES 70105 AND [phone].

## 2023-11-09 ENCOUNTER — Other Ambulatory Visit: Payer: Self-pay | Admitting: Family Medicine

## 2023-11-09 DIAGNOSIS — R748 Abnormal levels of other serum enzymes: Secondary | ICD-10-CM

## 2023-11-14 ENCOUNTER — Ambulatory Visit
Admission: RE | Admit: 2023-11-14 | Discharge: 2023-11-14 | Disposition: A | Discharge status: Home / Self Care | Source: Ambulatory Visit | Attending: Family Medicine | Admitting: Family Medicine

## 2023-11-14 DIAGNOSIS — R748 Abnormal levels of other serum enzymes: Secondary | ICD-10-CM

## 2023-12-07 ENCOUNTER — Other Ambulatory Visit: Payer: Self-pay | Admitting: Podiatry

## 2023-12-07 DIAGNOSIS — M25571 Pain in right ankle and joints of right foot: Secondary | ICD-10-CM | POA: Diagnosis not present

## 2023-12-07 DIAGNOSIS — M65871 Other synovitis and tenosynovitis, right ankle and foot: Secondary | ICD-10-CM | POA: Diagnosis not present

## 2023-12-07 MED ORDER — ACETAMINOPHEN 500 MG PO TABS: 1000.0000 mg | ORAL_TABLET | Freq: Four times a day (QID) | ORAL | 0 refills | Status: DC | PRN

## 2023-12-07 MED ORDER — IBUPROFEN 600 MG PO TABS: 600.0000 mg | ORAL_TABLET | Freq: Four times a day (QID) | ORAL | 0 refills | Status: AC | PRN

## 2023-12-07 MED ORDER — OXYCODONE HCL 5 MG PO TABS: 5.0000 mg | ORAL_TABLET | ORAL | 0 refills | Status: DC | PRN

## 2023-12-07 MED ORDER — GABAPENTIN 300 MG PO CAPS: 300.0000 mg | ORAL_CAPSULE | Freq: Three times a day (TID) | ORAL | 0 refills | Status: DC

## 2023-12-13 ENCOUNTER — Ambulatory Visit (INDEPENDENT_AMBULATORY_CARE_PROVIDER_SITE_OTHER)

## 2023-12-13 ENCOUNTER — Ambulatory Visit (INDEPENDENT_AMBULATORY_CARE_PROVIDER_SITE_OTHER): Payer: Self-pay | Admitting: Podiatry

## 2023-12-13 ENCOUNTER — Encounter: Payer: Self-pay | Admitting: Podiatry

## 2023-12-13 VITALS — BP 117/69 | HR 58 | Temp 97.0°F

## 2023-12-13 DIAGNOSIS — M65871 Other synovitis and tenosynovitis, right ankle and foot: Secondary | ICD-10-CM

## 2023-12-13 DIAGNOSIS — Z9889 Other specified postprocedural states: Secondary | ICD-10-CM

## 2023-12-13 NOTE — Patient Instructions (Addendum)
 Wound Care Keep the surgical site clean and dry. Change dressings as instructed by your surgeon. Avoid soaking the foot until wound is fully healed unless instructed otherwise by your surgeon. Watch for signs of infection: increased redness, swelling, warmth, discharge or foul odor. Contact our office immediately if you notice signs of infection or if you develop a fever. Temp over 100.4.  2.   Pain Management Take prescribed pain medications as directed by surgeon. Over-the-counter pain relievers (e.g. acetaminophen or ibuprofen) may be used if you are not allergic, are able to take them, and are approved by your surgeon.  Elevate your foot above heart level to help reduce swelling and pain. Apply ice packs for 15-20 minutes every 2-3 hours during the first 48 hours and as needed. AVOID DIRECT CONTACT WITH SKIN 3.    Activity and Mobility Follow weight-bearing restrictions carefully (non-weight bearing, partial weight-bearing or full weight-bearing) as instructed.  Use crutches, walker or other assistive devices as recommended. Avoid strenuous activities until cleared by your surgeon.  Gradually increase your activity level as advised.  4.    Footwear Wear any special post-operative shoe or boot that has been provided or recommended.  Avoid tight or ill-fitting shoes until the foot is fully healed. Do not walk barefoot. 5.    Follow-up Appointments Attend all scheduled follow-ups for wound checks, suture/staple removals and progress evaluation.  Notify your surgeon if you experience persistent pain, numbness or unusual symptoms.  6.    Signs to Watch For (When to Call Your Doctor) Severe pain not relieved by pain medication. Excessive swelling or bleeding. Signs of infection. Numbness or tingling. Difficulty breathing or chest pain (Call 911 or seek emergency medical care)  Plantar Fasciitis (Heel Spur Syndrome) with Rehab The plantar fascia is a fibrous, ligament-like, soft-tissue  structure that spans the bottom of the foot. Plantar fasciitis is a condition that causes pain in the foot due to inflammation of the tissue. SYMPTOMS  Pain and tenderness on the underneath side of the foot. Pain that worsens with standing or walking. CAUSES  Plantar fasciitis is caused by irritation and injury to the plantar fascia on the underneath side of the foot. Common mechanisms of injury include: Direct trauma to bottom of the foot. Damage to a small nerve that runs under the foot where the main fascia attaches to the heel bone. Stress placed on the plantar fascia due to bone spurs. RISK INCREASES WITH:  Activities that place stress on the plantar fascia (running, jumping, pivoting, or cutting). Poor strength and flexibility. Improperly fitted shoes. Tight calf muscles. Flat feet. Failure to warm-up properly before activity. Obesity. PREVENTION Warm up and stretch properly before activity. Allow for adequate recovery between workouts. Maintain physical fitness: Strength, flexibility, and endurance. Cardiovascular fitness. Maintain a health body weight. Avoid stress on the plantar fascia. Wear properly fitted shoes, including arch supports for individuals who have flat feet.  PROGNOSIS  If treated properly, then the symptoms of plantar fasciitis usually resolve without surgery. However, occasionally surgery is necessary.  RELATED COMPLICATIONS  Recurrent symptoms that may result in a chronic condition. Problems of the lower back that are caused by compensating for the injury, such as limping. Pain or weakness of the foot during push-off following surgery. Chronic inflammation, scarring, and partial or complete fascia tear, occurring more often from repeated injections.  TREATMENT  Treatment initially involves the use of ice and medication to help reduce pain and inflammation. The use of strengthening and stretching exercises may  help reduce pain with activity, especially  stretches of the Achilles tendon. These exercises may be performed at home or with a therapist. Your caregiver may recommend that you use heel cups of arch supports to help reduce stress on the plantar fascia. Occasionally, corticosteroid injections are given to reduce inflammation. If symptoms persist for greater than 6 months despite non-surgical (conservative), then surgery may be recommended.   MEDICATION  If pain medication is necessary, then nonsteroidal anti-inflammatory medications, such as aspirin and ibuprofen, or other minor pain relievers, such as acetaminophen, are often recommended. Do not take pain medication within 7 days before surgery. Prescription pain relievers may be given if deemed necessary by your caregiver. Use only as directed and only as much as you need. Corticosteroid injections may be given by your caregiver. These injections should be reserved for the most serious cases, because they may only be given a certain number of times.  HEAT AND COLD Cold treatment (icing) relieves pain and reduces inflammation. Cold treatment should be applied for 10 to 15 minutes every 2 to 3 hours for inflammation and pain and immediately after any activity that aggravates your symptoms. Use ice packs or massage the area with a piece of ice (ice massage). Heat treatment may be used prior to performing the stretching and strengthening activities prescribed by your caregiver, physical therapist, or athletic trainer. Use a heat pack or soak the injury in warm water.  SEEK IMMEDIATE MEDICAL CARE IF: Treatment seems to offer no benefit, or the condition worsens. Any medications produce adverse side effects.  EXERCISES- RANGE OF MOTION (ROM) AND STRETCHING EXERCISES - Plantar Fasciitis (Heel Spur Syndrome) These exercises may help you when beginning to rehabilitate your injury. Your symptoms may resolve with or without further involvement from your physician, physical therapist or athletic  trainer. While completing these exercises, remember:  Restoring tissue flexibility helps normal motion to return to the joints. This allows healthier, less painful movement and activity. An effective stretch should be held for at least 30 seconds. A stretch should never be painful. You should only feel a gentle lengthening or release in the stretched tissue.  RANGE OF MOTION - Toe Extension, Flexion Sit with your right / left leg crossed over your opposite knee. Grasp your toes and gently pull them back toward the top of your foot. You should feel a stretch on the bottom of your toes and/or foot. Hold this stretch for 10 seconds. Now, gently pull your toes toward the bottom of your foot. You should feel a stretch on the top of your toes and or foot. Hold this stretch for 10 seconds. Repeat  times. Complete this stretch 3 times per day.   RANGE OF MOTION - Ankle Dorsiflexion, Active Assisted Remove shoes and sit on a chair that is preferably not on a carpeted surface. Place right / left foot under knee. Extend your opposite leg for support. Keeping your heel down, slide your right / left foot back toward the chair until you feel a stretch at your ankle or calf. If you do not feel a stretch, slide your bottom forward to the edge of the chair, while still keeping your heel down. Hold this stretch for 10 seconds. Repeat 3 times. Complete this stretch 2 times per day.   STRETCH  Gastroc, Standing Place hands on wall. Extend right / left leg, keeping the front knee somewhat bent. Slightly point your toes inward on your back foot. Keeping your right / left heel on the floor  and your knee straight, shift your weight toward the wall, not allowing your back to arch. You should feel a gentle stretch in the right / left calf. Hold this position for 10 seconds. Repeat 3 times. Complete this stretch 2 times per day.  STRETCH  Soleus, Standing Place hands on wall. Extend right / left leg, keeping the  other knee somewhat bent. Slightly point your toes inward on your back foot. Keep your right / left heel on the floor, bend your back knee, and slightly shift your weight over the back leg so that you feel a gentle stretch deep in your back calf. Hold this position for 10 seconds. Repeat 3 times. Complete this stretch 2 times per day.  STRETCH  Gastrocsoleus, Standing  Note: This exercise can place a lot of stress on your foot and ankle. Please complete this exercise only if specifically instructed by your caregiver.  Place the ball of your right / left foot on a step, keeping your other foot firmly on the same step. Hold on to the wall or a rail for balance. Slowly lift your other foot, allowing your body weight to press your heel down over the edge of the step. You should feel a stretch in your right / left calf. Hold this position for 10 seconds. Repeat this exercise with a slight bend in your right / left knee. Repeat 3 times. Complete this stretch 2 times per day.   STRENGTHENING EXERCISES - Plantar Fasciitis (Heel Spur Syndrome)  These exercises may help you when beginning to rehabilitate your injury. They may resolve your symptoms with or without further involvement from your physician, physical therapist or athletic trainer. While completing these exercises, remember:  Muscles can gain both the endurance and the strength needed for everyday activities through controlled exercises. Complete these exercises as instructed by your physician, physical therapist or athletic trainer. Progress the resistance and repetitions only as guided.  STRENGTH - Towel Curls Sit in a chair positioned on a non-carpeted surface. Place your foot on a towel, keeping your heel on the floor. Pull the towel toward your heel by only curling your toes. Keep your heel on the floor. Repeat 3 times. Complete this exercise 2 times per day.  STRENGTH - Ankle Inversion Secure one end of a rubber exercise  band/tubing to a fixed object (table, pole). Loop the other end around your foot just before your toes. Place your fists between your knees. This will focus your strengthening at your ankle. Slowly, pull your big toe up and in, making sure the band/tubing is positioned to resist the entire motion. Hold this position for 10 seconds. Have your muscles resist the band/tubing as it slowly pulls your foot back to the starting position. Repeat 3 times. Complete this exercises 2 times per day.  Document Released: 02/13/2005 Document Revised: 05/08/2011 Document Reviewed: 05/28/2008 [facility] Patient Information 2014 [place], [place].

## 2023-12-13 NOTE — Progress Notes (Signed)
 Patient presents for post-op visit today, POV # 1 DOS 12/07/23 RT ANKLE AND SUBTALAR JOINT ARTHROPLASTY, REMOVAL OF FOREIGN/LOOSE BODY  Doing fine. Only pain is first thing in the morning, could be because of the bandage. Primarily on the top of the foot. I have been taking the ibuprofen and the gabapentin.   Vital Signs: Today's Vitals   12/13/23 0846  BP: 117/69  Pulse: (!) 58  Temp: (!) 97 F (36.1 C)  TempSrc: Oral  PainSc: 2   PainLoc: Foot  Low HR: Patient reports normal low HR. PCP is aware.   Radiographs: [x]  Taken []  Not taken  Surgical Site Assessment:  - Dressing:  [x]  Minimal dry blood, intact []  Reinforced   [x]  Changed     -RN Notes: n/a  - Incision:  [x]  CDI (clean, dry, intact)  [x]  Mild erythema  []  Drainage noted   -RN Notes: n/a  - Swelling:  []  None  [x]  Mild  []  Moderate   []  Significant    - Bruising:  []  None  [x]  Present: very minimal on anterior aspect of ankle.   - Sutures/staples:  [x]  Intact  []  Removed Today  [x]  Plan to remove at next visit    -Cast/Splint/Pins: [x]  None []  Intact  []  Removed []  Plan to remove at next visit []  Replaced  -Signs of infection:  [x]  None  []  Present - Describe: n/a  -DME:    [x]  AFW []  Surgical shoe []  Cast  []  Splint  -Walking status:  [x]  Full WB  []  Partial WB  []  NWB  -Utilizing device:  [x]  None []  Knee Scooter []  Crutches []  Wheelchair    DVT assessment:  [x]  Denies symptoms []  Chest pain/SOB []  Pain in calf/redness/warmth   Redressed DSD and ace wrap. Educated on signs of infection, proper dressing care, pain management, and weight bearing status. Patient will contact provider with any new or worsening symptoms. The provider assessed the patient today and reviewed instructions regarding plan of care.    Addendum note: Patient doing well reviewed intraoperative findings and showed him photographs of the loose bodies and synovitis.  Discussed long-term treatment of arthritis.  Starting to get some  plantar fasciitis on the left foot recommended home physical therapy plan for this and this was dispensed and his ibuprofen should help with this as well.  May consider injection at next visit if not improving.

## 2023-12-20 ENCOUNTER — Encounter: Payer: Self-pay | Admitting: Podiatry

## 2023-12-27 ENCOUNTER — Ambulatory Visit

## 2023-12-27 ENCOUNTER — Encounter: Payer: Self-pay | Admitting: Podiatry

## 2023-12-27 DIAGNOSIS — M2142 Flat foot [pes planus] (acquired), left foot: Secondary | ICD-10-CM

## 2023-12-27 DIAGNOSIS — M65871 Other synovitis and tenosynovitis, right ankle and foot: Secondary | ICD-10-CM

## 2023-12-27 DIAGNOSIS — M2141 Flat foot [pes planus] (acquired), right foot: Secondary | ICD-10-CM

## 2023-12-27 DIAGNOSIS — M722 Plantar fascial fibromatosis: Secondary | ICD-10-CM

## 2023-12-27 NOTE — Progress Notes (Signed)
 Patient presents for post-op visit today, POV # 2 DOS 12/07/23 RT ANKLE AND SUBTALOR JOINT ARTHROPLASTY, REMOVAL OF FOREIGN/LOOSE BODY  Doing well since last visit. Main thing is just the itching..  RN Notes: n/a  Vital Signs: Today's Vitals   12/27/23 9166  PainSc: 0-No pain      Radiographs: []  Taken [x]  Not taken  Surgical Site Assessment:  - Dressing:  [x]  Minimal dry blood, intact []  Reinforced   []  Changed     -RN Notes: n/a  - Incision:  [x]  CDI (clean, dry, intact)  []  Mild erythema  []  Drainage noted   -RN Notes: n/a  - Swelling:  []  None  [x]  Mild  []  Moderate   []  Significant     -RN Notes: medial/lateral aspect ankle  - Bruising:  [x]  None  []  Present: n/a   - Sutures/Staples:  []  None [x]  Intact  [x]  Removed Today  []  Plan to remove at next visit   -Cast/Splint/Pins: [x]  None []  Intact []  Removed Today []  Plan to remove at next visit []  Replaced  -Signs of infection:  [x]  None  []  Present - Describe: n/a  -DME:    []  None [x]  AFW []  Surgical shoe []  Cast  []  Splint  -Walking status:  [x]  Full WB  []  Partial WB  []  NWB  -Utilizing device:  [x]  None []  Knee Scooter []  Crutches []  Wheelchair    DVT assessment:  [x]  Denies symptoms []  Chest pain/SOB []  Pain in calf/redness/warmth   Redressed DSD and ace wrap. Educated on signs of infection, proper dressing care, pain management, and weight bearing status. Patient will contact provider with any new or worsening symptoms. The provider assessed the patient today and reviewed instructions regarding plan of care.    I saw and evaluated the patient personally his incisions are well-healed and sutures removed uneventfully.  Return to regular shoe gear and activity as tolerated begin physical therapy and referral was placed for this.  Compression ankle dispensed.  Follow-up with me in 6 weeks.  Okay to drive

## 2023-12-31 ENCOUNTER — Encounter: Payer: Self-pay | Admitting: Podiatry

## 2024-01-01 ENCOUNTER — Encounter: Payer: Self-pay | Admitting: Lab

## 2024-01-17 ENCOUNTER — Encounter: Admitting: Podiatry

## 2024-02-06 ENCOUNTER — Other Ambulatory Visit: Payer: Self-pay

## 2024-02-06 ENCOUNTER — Ambulatory Visit (HOSPITAL_BASED_OUTPATIENT_CLINIC_OR_DEPARTMENT_OTHER): Admitting: Physical Therapy

## 2024-02-06 DIAGNOSIS — M25671 Stiffness of right ankle, not elsewhere classified: Secondary | ICD-10-CM | POA: Insufficient documentation

## 2024-02-06 DIAGNOSIS — M2142 Flat foot [pes planus] (acquired), left foot: Secondary | ICD-10-CM | POA: Insufficient documentation

## 2024-02-06 DIAGNOSIS — M2141 Flat foot [pes planus] (acquired), right foot: Secondary | ICD-10-CM | POA: Diagnosis not present

## 2024-02-06 DIAGNOSIS — M722 Plantar fascial fibromatosis: Secondary | ICD-10-CM | POA: Insufficient documentation

## 2024-02-06 DIAGNOSIS — M6281 Muscle weakness (generalized): Secondary | ICD-10-CM | POA: Insufficient documentation

## 2024-02-06 DIAGNOSIS — M25571 Pain in right ankle and joints of right foot: Secondary | ICD-10-CM | POA: Insufficient documentation

## 2024-02-06 DIAGNOSIS — M65871 Other synovitis and tenosynovitis, right ankle and foot: Secondary | ICD-10-CM | POA: Insufficient documentation

## 2024-02-06 NOTE — Therapy (Signed)
 OUTPATIENT PHYSICAL THERAPY LOWER EXTREMITY EVALUATION   Patient Name: Kenneth Garcia MRN: [id number] DOB:1975/05/22, 48 y.o., male Today's Date: 02/07/2024  END OF SESSION:  PT End of Session - 02/06/24 1541     Visit Number 1    Number of Visits 18    Date for Recertification  04/16/24    Authorization Type AETNA    PT Start Time 1535    PT Stop Time 1628    PT Time Calculation (min) 53 min    Activity Tolerance Patient tolerated treatment well    Behavior During Therapy [facility] for tasks assessed/performed          Past Medical History:  Diagnosis Date   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   Allergy     pollen   Past Surgical History:  Procedure Laterality Date   EXCISION / CURETTAGE BONE CYST PHALANGES OF FOOT N/A    both left and right implants in both feet to correct flat feet   VASECTOMY  2019   Patient Active Problem List   Diagnosis Date Noted   Seasonal and perennial allergic rhinitis 07/29/2019   Allergic conjunctivitis 07/29/2019   Recurrent maxillary sinusitis 07/29/2019   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   Hyperlipidemia 08/07/2018    PCP: ***  REFERRING PROVIDER: Silva Juliene SAUNDERS, DPM   REFERRING DIAG: [phone] (ICD-10-CM) - Other synovitis and tenosynovitis, right ankle and foot M21.41,M21.42 (ICD-10-CM) - Pes planus of both feet M72.2 (ICD-10-CM) - Plantar fasciitis of right foot  THERAPY DIAG:  Pain in right ankle and joints of right foot  Stiffness of right ankle, not elsewhere classified  Muscle weakness (generalized)  Rationale for Evaluation and Treatment: Rehabilitation  ONSET DATE: DOS 12/07/2023  SUBJECTIVE:   SUBJECTIVE STATEMENT:   When pt was 48 years old, he had silicone implants in his subtalar joints to correct flat foot.  Pt began having pain in R ankle approx 15 years ago.  The pt was intermittent though began to worsen over the past 10 years.  He was limited with his activity level due to the pain.  Pt states the silicone implant  fragmented on his R, but his L ankle feels ok.  MD note indicated R ankle and subtalar joint arthroplasty, removal of foreign/loose body on 12/07/23.  PT messaged MD concerning dx and any restrictions.  MD returned the message indicating: It wasn't a replacement, we removed synovitis and a destroyed silicone implant. No restrictions, can be full weight and ROM.  Pt states MD informed him he has arthritis.  He was in the boot for 2 weeks and was Wb'ing in the boot immediately.  Pt wears a compression on his R ankle.     Pt is limited with standing and walking.  Pt has difficulty with stairs.  Pt is unable to run with kids. Pt wears a compression sleeve.  He has bilat feet pain due to bilat flat feet.  Pt has tried orthotic inserts though they make it worse.      10/30 note indicated to return to regular shoe. And ordered PT  1st post op visit--Starting to get some plantar fasciitis on the left foot recommended home physical therapy plan.  MD gave him exercises for plantar fasciitis.  He bought a Dr. Heriberto wrap which helped.      Evaluate and treat for 1-2 sessions / week for 4-6 weeks or at therapist's discretion. Patient has prior silicone implant removal with arthroscopy has flatfoot deformity, would like to include ROM, strengthening, stability, and strengthening of  medial and posterior musculature. Modalities PRN at therapist's discretion. Weightbearing as tolerated no restrictions      PERTINENT HISTORY: bilat feet pain due to bilat flat feet. Recent L foot plantar fasciitis which is doing much better now L knee chondromalacia, L knee pain  PAIN:  Feels more stiff than pain. NPRS:  3/10 current, 5/10 worst, 2/10 best Location: Worst pain is when he has been on his feet a lot Aggravating Factors:  walking and standing 10 mins,1st thing in AM Easing:  sitting, resting, elevation  PRECAUTIONS: Other: per surgery    WEIGHT BEARING RESTRICTIONS: Yes WBAT  FALLS:  Has  patient fallen in last 6 months? 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   PLOF: Independent  PATIENT GOALS: strengthening his foot, reduce stiffness, run a limited distance, return to using the elliptical  NEXT MD VISIT: 02/07/24  OBJECTIVE:  Note: Objective measures were completed at Evaluation unless otherwise noted.  DIAGNOSTIC FINDINGS: Pt is post op.  PATIENT SURVEYS:  LEFS:  40/80  COGNITION: Overall cognitive status: Within functional limits for tasks assessed     OBSERVATION: Portals healed well without any signs of infection.    LOWER EXTREMITY ROM:  {AROM/PROM:27142} ROM Right eval Left eval  Hip flexion    Hip extension    Hip abduction    Hip adduction    Hip internal rotation    Hip external rotation    Knee flexion    Knee extension    Ankle dorsiflexion AROM/PROM:  4/9 16  Ankle plantarflexion 52 64  Ankle inversion AROM/PROM: 11/15 27  Ankle eversion PROM: 4/2 10   (Blank rows = not tested)  LOWER EXTREMITY MMT:  MMT Right eval Left eval  Hip flexion    Hip extension    Hip abduction    Hip adduction    Hip internal rotation    Hip external rotation    Knee flexion    Knee extension    Ankle dorsiflexion    Ankle plantarflexion    Ankle inversion    Ankle eversion     (Blank rows = not tested)    GAIT: Assistive device utilized: None Level of assistance: Complete Independence Comments: Pt ambulating with a heel to toe gait without AD.  Pt slightly favoring R LE at times though not limping.                                                                                                                                  TREATMENT:    He states he has been performing ankle pumps and ankle ABC at home.  Pt performed ankle pumps approx 15-20, ankle circles, and supine  SLR PATIENT EDUCATION:  Education details: *** Person educated: {Person educated:25204} Education method: {Education  Method:25205} Education comprehension: {Education Comprehension:25206}  HOME EXERCISE PROGRAM: Access Code: [email or web address] URL: [email or web address] Date: 02/06/2024 Prepared by: Mose Minerva  Exercises - ANKLE PUMPS  - 2-3 x daily - 7 x weekly - 2 sets - 10 reps - Seated Ankle Circles  - 2 x daily - 7 x weekly - 2 sets - 10 reps - Supine Active Straight Leg Raise  - 1 x daily - 7 x weekly - 2 sets - 10 reps  ASSESSMENT:  CLINICAL IMPRESSION: Patient is a 48 y.o. male (8 weeks and 5 days s/p)    OBJECTIVE IMPAIRMENTS: {opptimpairments:25111}.   ACTIVITY LIMITATIONS: {activitylimitations:27494}  PARTICIPATION LIMITATIONS: {participationrestrictions:25113}  PERSONAL FACTORS: {Personal factors:25162} are also affecting patient's functional outcome.   REHAB POTENTIAL: Good  CLINICAL DECISION MAKING: Stable/uncomplicated  EVALUATION COMPLEXITY: Low   GOALS:   SHORT TERM GOALS: Target date: *** *** Baseline: Goal status: INITIAL  2.  *** Baseline:  Goal status: INITIAL  3.  *** Baseline:  Goal status: INITIAL  4.  *** Baseline:  Goal status: INITIAL  5.  *** Baseline:  Goal status: INITIAL  6.  *** Baseline:  Goal status: INITIAL  LONG TERM GOALS: Target date: 04/16/2024  *** Baseline:  Goal status: INITIAL  2.  *** Baseline:  Goal status: INITIAL  3.  *** Baseline:  Goal status: INITIAL  4.  *** Baseline:  Goal status: INITIAL  5.  *** Baseline:  Goal status: INITIAL  6.  *** Baseline:  Goal status: INITIAL   PLAN:  PT FREQUENCY: 1-2x/week  PT DURATION: other: 8-10 weeks  PLANNED INTERVENTIONS: {rehab planned interventions:25118::97110-Therapeutic exercises,97530- Therapeutic [phone]- Neuromuscular re-education,97535- Self Rjmz,02859- Manual therapy,Patient/Family  education}  PLAN FOR NEXT SESSION: PIERRETTE Mose Minerva, PT 02/07/2024, 10:52 PM

## 2024-02-07 ENCOUNTER — Ambulatory Visit (INDEPENDENT_AMBULATORY_CARE_PROVIDER_SITE_OTHER): Admitting: Podiatry

## 2024-02-07 ENCOUNTER — Encounter (HOSPITAL_BASED_OUTPATIENT_CLINIC_OR_DEPARTMENT_OTHER): Payer: Self-pay | Admitting: Physical Therapy

## 2024-02-07 ENCOUNTER — Ambulatory Visit

## 2024-02-07 VITALS — Ht 70.75 in | Wt 188.0 lb

## 2024-02-07 DIAGNOSIS — M65871 Other synovitis and tenosynovitis, right ankle and foot: Secondary | ICD-10-CM

## 2024-02-07 DIAGNOSIS — M216X2 Other acquired deformities of left foot: Secondary | ICD-10-CM

## 2024-02-07 DIAGNOSIS — M216X1 Other acquired deformities of right foot: Secondary | ICD-10-CM

## 2024-02-07 NOTE — Patient Instructions (Signed)
 Check with your insurance if they cover custom molded foot orthotics. The codes they will want are: L3020 (procedure code for the orthotics) and M21.6X1 and M21.6X2 for acquired foot deformity

## 2024-02-07 NOTE — Progress Notes (Signed)
°  Subjective:  Patient ID: Kenneth Garcia, male    DOB: 1976/01/31,  MRN: [id number]  Chief Complaint  Patient presents with   Post-op Follow-up    RM 8 Patient is here for post-op follow-up.  Pt states no pain, just minor stiffness in the right ankle. Pt states starting pt recently.    DOS: 12/07/2023 Procedure: Subtalar and ankle right  47 y.o. male returns for post-op check.  Overall doing well feels some stiffness but his pain is improving  Review of Systems: Negative except as noted in the HPI. Denies N/V/F/Ch.   Objective:  There were no vitals filed for this visit. Body mass index is 26.41 kg/m. Constitutional Well developed. Well nourished.  Vascular Foot warm and well perfused. Capillary refill normal to all digits.  Calf is soft and supple, no posterior calf or knee pain, negative Homans' sign  Neurologic Normal speech. Oriented to person, place, and time. Epicritic sensation to light touch grossly present bilaterally.  Dermatologic His incisions are well-healed there is some deep hypertrophy of the posterior subtalar incision  Orthopedic: Minimal tenderness to palpation noted about the surgical site.   Multiple view plain film radiographs: Improvement in ankle and subtalar joint effusions, joint spaces maintained Assessment:   1. Other synovitis and tenosynovitis, right ankle and foot   2. Pronation of left foot   3. Pronation of right foot    Plan:  Patient was evaluated and treated and all questions answered.  S/p foot surgery right -Progressing as expected post-operatively. -XR: No issues joint spaces maintained still has pes planus -WB Status: Weightbearing as tolerated in regular shoe gear.  He has started physical therapy -We discussed long-term support and he has previously had rigid corrective custom molded foot orthoses that were very uncomfortable for him, I do think he would benefit still from a custom molded foot orthosis that is accommodative in  nature, he is going to check to see if Hulan is charles schwab covers this with us  here or if would be best from the [place], discussed I can refer her to him either way and the CPT and ICD-10 codes were provided.  Return in 2 months to reevaluate.  Long-term we also discussed that surgical flatfoot reconstruction is an option as well likely would need subtalar arthrodesis cotton osteotomy and medial soft tissue rebalancing.  Follow-up sooner if issues he will let me know which we need to proceed with orthotic casting.  Return in about 2 months (around 04/09/2024) for surgery follow up after PT (new right ankel and foot XRs).

## 2024-02-11 ENCOUNTER — Encounter: Payer: Self-pay | Admitting: Podiatry

## 2024-02-11 ENCOUNTER — Encounter (HOSPITAL_BASED_OUTPATIENT_CLINIC_OR_DEPARTMENT_OTHER): Payer: Self-pay | Admitting: Physical Therapy

## 2024-02-13 ENCOUNTER — Encounter: Payer: Self-pay | Admitting: Podiatry

## 2024-02-13 ENCOUNTER — Encounter (HOSPITAL_BASED_OUTPATIENT_CLINIC_OR_DEPARTMENT_OTHER): Payer: Self-pay | Admitting: Physical Therapy

## 2024-02-13 ENCOUNTER — Ambulatory Visit (HOSPITAL_BASED_OUTPATIENT_CLINIC_OR_DEPARTMENT_OTHER): Admitting: Physical Therapy

## 2024-02-13 DIAGNOSIS — M25571 Pain in right ankle and joints of right foot: Secondary | ICD-10-CM | POA: Diagnosis not present

## 2024-02-13 DIAGNOSIS — M6281 Muscle weakness (generalized): Secondary | ICD-10-CM

## 2024-02-13 DIAGNOSIS — M25671 Stiffness of right ankle, not elsewhere classified: Secondary | ICD-10-CM

## 2024-02-13 NOTE — Therapy (Signed)
 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   Allergy     pollen   Past Surgical History:  Procedure Laterality Date   EXCISION / CURETTAGE BONE CYST PHALANGES OF FOOT N/A    both left and right implants in both feet to correct flat feet   VASECTOMY  2019   Patient Active Problem List   Diagnosis Date Noted   Seasonal and perennial allergic rhinitis 07/29/2019   Allergic conjunctivitis 07/29/2019   Recurrent maxillary sinusitis 07/29/2019   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 No questions, HEP is going pretty well. Noticing some bumps and lumps on the scars on the outside of the R foot, MD said no restrictions and this is likely scar tissue      EVAL: When pt was 48 years old, he had silicone implants in his subtalar joints to correct flat foot.  Pt began having pain in R ankle  approx 15 years ago.  The pain was intermittent though began to worsen over the past 10 years.  He was limited with his activity level due to the pain.  Pt states the silicone implant fragmented on his R, but his L ankle feels ok.  MD note indicated R ankle and subtalar joint arthroplasty, removal of foreign/loose body on 12/07/23.  PT messaged MD concerning dx and any restrictions.  MD returned the message indicating: It wasn't a replacement, we removed synovitis and a destroyed silicone implant. No restrictions, can be full weight and ROM.  Pt states MD informed him he has arthritis.  He was in the boot for 2 weeks and was Wb'ing in the boot immediately.    PT order indicated:  Evaluate and treat for 1-2 sessions / week for 4-6 weeks or at therapist's discretion. Patient has prior silicone implant removal with arthroscopy has flatfoot deformity, would like to include ROM, strengthening, stability, and strengthening of medial and posterior musculature. Modalities PRN at therapist's discretion. Weightbearing as tolerated no restrictions  Pt is limited with standing and walking.  Pt has difficulty with stairs.  He is unable to run with kids. Pt wears a compression sleeve.  He has bilat feet pain due to bilat flat feet.  Pt has tried orthotic inserts though they make it worse.  On his 1st post op visit, note  indicated he started getting some plantar fasciitis on the left foot.  MD gave him exercises for plantar fasciitis.  He bought a Dr. Heriberto wrap which helped.     PERTINENT HISTORY: R ankle surgery 12/07/23 bilat feet pain due to bilat flat feet.  Pt has a silicone implant in L ankle. Recent L foot plantar fasciitis which is doing much better now L knee chondromalacia, L knee pain  PAIN:   NPRS:  3/10 current Location:  B ankles, R>L especially below medial malleolus R and lateral forefoot L  Description:  Aggravating Factors:  walking and standing 10 mins, 1st thing in AM Easing:   sitting, resting, elevation  PRECAUTIONS: Other: per surgery    WEIGHT BEARING RESTRICTIONS: Yes WBAT  FALLS:  Has patient fallen in last 6 months? 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   PLOF: Independent  PATIENT GOALS: strengthening his foot, reduce stiffness, run a limited distance, return to using the elliptical  NEXT MD VISIT: 02/07/24  OBJECTIVE:  Note: Objective measures were completed at Evaluation unless otherwise noted.  DIAGNOSTIC FINDINGS: Pt is post op.  PATIENT SURVEYS:  LEFS:  40/80  COGNITION: Overall cognitive status: Within functional limits for tasks assessed     OBSERVATION: Portals healed well without any signs of infection.    LOWER EXTREMITY ROM:   ROM Right eval Left Eval AROM  Hip flexion    Hip extension    Hip abduction    Hip adduction    Hip internal rotation    Hip external rotation    Knee flexion    Knee extension    Ankle dorsiflexion AROM/PROM:  4/9 16  Ankle plantarflexion AROM 52 64  Ankle inversion AROM/PROM: 11/15 27  Ankle eversion AROM/PROM: 4/2 10   (Blank rows = not tested)    GAIT: Assistive device utilized: None Level of assistance: Complete Independence Comments: Pt ambulating with a heel to toe gait without AD.  Pt slightly favoring R LE at times though not limping.                                                                                                                                  TREATMENT:    02/13/24  Scifit bike seat 11 L4x6 minutes  Gastroc stretches 3x30 seconds B passive  Ankle DF mobs B to tolerance Scar massage R ankle all directions, education on how to do this at home  Ankle circles CW/CCW x10 each way B Ankle alphabet x2 B  Bridges + ABD into red TB x15  Sidelying hip ABD red TB x12 B     PATIENT  EDUCATION:  Education details: dx, relevant anatomy, objective findings, HEP, rationale of interventions, and POC.  PT answered pt's questions.  Person educated:  Patient Education method: Explanation, Demonstration, Tactile cues, Verbal cues, and Handouts Education comprehension: verbalized understanding, returned demonstration, verbal cues required, tactile cues required, and needs further education  HOME EXERCISE PROGRAM: Access Code: [email or web address] URL: [email or web address] Date: 02/06/2024 Prepared by: Mose Minerva  Exercises - ANKLE PUMPS  - 2-3 x daily - 7 x weekly - 2 sets - 10 reps - Seated Ankle Circles  - 2 x daily - 7 x weekly - 2 sets - 10 reps - Supine Active Straight Leg Raise  - 1 x daily - 7 x weekly - 2 sets - 10 reps  ASSESSMENT:  CLINICAL IMPRESSION:  Arrived today doing well, we focused on ankle ROM and manual techniques today to help address ankle mobility and improve gait pattern. Introduced and educated on scar massage as well. Will continue to progress all interventions as tolerated.    EVAL:Patient is a 48 y.o. male 8 weeks and 5 days s/p R ankle surgery consisting of removing synovitis and a destroyed silicone implant.  Pt also has dx's of pes planus bilat and R foot plantar fasciitis.  Pt saw MD about L foot plantar fasciitis which is doing better now.  He has expected post op limitations of R ankle pain, limited ankle ROM, and muscle weakness in R LE.  Pt is limited with his normal functional mobility skills including walking and stairs.  He is limited with standing and unable to run with kids.  Pt will benefit from skilled PT to address impairments and improve overall function.     OBJECTIVE IMPAIRMENTS: decreased activity tolerance, decreased endurance, decreased mobility, difficulty walking, decreased ROM, decreased strength, hypomobility, and pain.   ACTIVITY LIMITATIONS: standing, stairs, and locomotion level  PARTICIPATION LIMITATIONS:  running/playing with kids  PERSONAL FACTORS: 1-2 comorbidities: bilat feet pain, L knee chondromalacia are also affecting patient's functional outcome.   REHAB POTENTIAL: Good  CLINICAL DECISION MAKING: Stable/uncomplicated  EVALUATION COMPLEXITY: Low   GOALS:   SHORT TERM GOALS: Target date: 03/05/2024  Pt will be independent and compliant with HEP for improved ROM, pain, strength, and function. Baseline: Goal status: INITIAL  2.  Pt will demo improved R ankle AROM to at least 15 deg in DF, 25 deg in inversion, and 10-12 deg in eversion for improved ankle stiffness and mobility.  Baseline:  Goal status: INITIAL  3.  Pt will report at least a 25% improvement in tolerance with standing and ambulation.  Baseline:  Goal status: INITIAL  4.  Pt will demo symmetrical height with bilat heel raises and tolerate heel raises without increased pain for improved calf strength.  Baseline:  Goal status: INITIAL Target date:  03/19/2024  5.  Pt will be able to perform a 6 inch step up with good control and good form.  Baseline:  Goal status: INITIAL Target date:  03/12/2024   LONG TERM GOALS: Target date: 04/16/2024  Pt will demo 5/5 R ankle strength in DF, Eve, and Inv and tolerate good manual resistance in PF for improved strength and performance of functional mobility.  Baseline:  Goal status: INITIAL  2.  Pt will be ambulating extended community distance without increased ankle pain and difficulty.   Baseline:  Goal status: INITIAL  3.  Pt will be able to ascend and descend stairs with a reciprocal gait without the rail.  Baseline:  Goal status: INITIAL  4.  Pt will report he is able to perform his normal standing activities and IADL's without significant pain and difficulty.  Baseline:  Goal status: INITIAL  5.  Pt will score 0-1 on the lateral step down test for improved eccentric control and performance of stairs.   Baseline:  Goal status: INITIAL    PLAN:  PT  FREQUENCY: 1-2x/week  PT DURATION: other: 8-10 weeks  PLANNED INTERVENTIONS: 97164- PT Re-evaluation, 97750- Physical Performance Testing, 97110-Therapeutic exercises, 97530- Therapeutic activity, [id number]- Neuromuscular re-education, 97535- Self Care, 02859- Manual therapy, [id number]- Gait training, [phone]- Aquatic Therapy, [phone]- Electrical stimulation (unattended), 97035- Ultrasound, 79439 (1-2 muscles), 20561 (3+ muscles)- Dry Needling, Patient/Family education, Balance training, Stair training, Taping, Joint mobilization, Scar mobilization, Cryotherapy, and Moist heat  PLAN FOR NEXT SESSION: review and perform HEP.  Cont with ankle ROM.  Consider Ankle ABC, ankle inv/eve on towel, seated toe/heel raises, long sitting gastroc stretch next visit.  Ice as needed. Did he have any questions about scar massage?   Josette Rough, PT, DPT 02/13/2024 2:27 PM

## 2024-02-15 ENCOUNTER — Ambulatory Visit (HOSPITAL_BASED_OUTPATIENT_CLINIC_OR_DEPARTMENT_OTHER): Admitting: Physical Therapy

## 2024-02-15 DIAGNOSIS — M6281 Muscle weakness (generalized): Secondary | ICD-10-CM

## 2024-02-15 DIAGNOSIS — M25571 Pain in right ankle and joints of right foot: Secondary | ICD-10-CM | POA: Diagnosis not present

## 2024-02-15 DIAGNOSIS — M25671 Stiffness of right ankle, not elsewhere classified: Secondary | ICD-10-CM

## 2024-02-15 NOTE — Therapy (Signed)
 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   Allergy     pollen   Past Surgical History:  Procedure Laterality Date   EXCISION / CURETTAGE BONE CYST PHALANGES OF FOOT N/A    both left and right implants in both feet to correct flat feet   VASECTOMY  2019   Patient Active Problem List   Diagnosis Date Noted   Seasonal and perennial allergic rhinitis 07/29/2019   Allergic conjunctivitis 07/29/2019   Recurrent maxillary sinusitis 07/29/2019   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  Pt states his thighs are a little sore from his prior therapy.  Pt denies any adverse effects after prior treatment.  Pt reports having stiffness in ankle.  Pt states one of his scars feels like it has scar tissue build-up and MD agreed.  Pt saw MD and he was pleased.  Pt reports compliance with HEP and  has been performing scar tissue massage to portals.       PERTINENT HISTORY: R ankle surgery 12/07/23 bilat feet pain due to bilat flat feet.  Pt has a silicone implant in L ankle. Recent L foot plantar fasciitis which is doing much better now L knee chondromalacia, L knee pain  PAIN:   NPRS:  4/10 current Location:  R anterior and lateral ankle  Description:  Aggravating Factors:  walking and standing 10 mins, 1st thing in AM Easing:  sitting, resting, elevation  PRECAUTIONS: Other: per surgery    WEIGHT BEARING RESTRICTIONS: Yes WBAT  FALLS:  Has patient fallen in last 6 months? 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   PLOF: Independent  PATIENT GOALS: strengthening his foot, reduce stiffness, run a limited distance, return to using the elliptical  NEXT MD VISIT: 02/07/24  OBJECTIVE:  Note: Objective measures were completed at Evaluation unless otherwise noted.  DIAGNOSTIC FINDINGS: Pt is post op.  PATIENT SURVEYS:  LEFS:  40/80  COGNITION: Overall cognitive status: Within functional limits for tasks assessed     OBSERVATION: Portals healed well without any signs of infection.    LOWER EXTREMITY ROM:   ROM  Right eval Left Eval AROM  Hip flexion    Hip extension    Hip abduction    Hip adduction    Hip internal rotation    Hip external rotation    Knee flexion    Knee extension    Ankle dorsiflexion AROM/PROM:  4/9 16  Ankle plantarflexion AROM 52 64  Ankle inversion AROM/PROM: 11/15 27  Ankle eversion AROM/PROM: 4/2 10   (Blank rows = not tested)    GAIT: Assistive device utilized: None Level of assistance: Complete Independence Comments: Pt ambulating with a heel to toe gait without AD.  Pt slightly favoring R LE at times though not limping.                                                                                                                                   TREATMENT:    02/15/24  Reviewed pt presentation, HEP compliance, pain level, and response to prior treatment.  Scifit bike seat 11 L3-4x6 minutes Ankle pumps x 20 reps Ankle circles cw and ccw Ankle Inv and Eve on towel Ankle Inv and Eve seated open chain  Ankle ABC's x 1 rep Seated heel raises 2x10 Seated toe raises 2x10  Pt received R ankle PROM in DF, PF, Ev, and Inv per pt and tissue tolerance. Pt received scar tissue massage to lateral portals.   PT gave pt a HEP handout.  PT educated pt in correct form and appropriate frequency.  PT instructed pt to not perform into a painful range.    PATIENT EDUCATION:  Education details: dx, relevant anatomy, exercise form, HEP, rationale of interventions, and POC.  PT answered pt's questions.  Person educated: Patient Education method: Explanation, Demonstration, Tactile cues, Verbal cues, and Handouts Education comprehension: verbalized understanding, returned demonstration, verbal cues required, tactile cues required, and needs further education  HOME EXERCISE PROGRAM: Access Code: [email or web address] URL: [email or web address] Date: 02/06/2024 Prepared by: Mose Minerva  Exercises - ANKLE PUMPS  - 2-3 x daily - 7 x weekly - 2 sets - 10 reps - Seated Ankle Circles  - 2 x daily - 7 x weekly - 2 sets - 10 reps - Supine Active Straight Leg Raise  - 1 x daily - 7 x weekly - 2 sets - 10 reps  Updated HEP: - Supine Ankle Inversion Eversion AROM  - 2 x daily - 7 x weekly - 2 sets - 10 reps - Seated Heel Raise  - 1 x daily - 7 x weekly - 2 sets - 10 reps - Seated Toe Raise  - 1 x daily - 7 x weekly - 2 sets - 10 reps  CLINICAL IMPRESSION:  PT focused on ankle ROM.  PT provided instruction and cuing for correct form with exercises and he performed exercises well.  PT performed ankle PROM per pt and tissue tolerance.  Pt is improving with  ankle mobility. He had good tolerance with exercises  and PROM.  PT updated HEP and gave pt a HEP handout.  Pt demonstrates good understanding of HEP.  He responded well to Rx having no c/o's after treatment.  Pt will benefit from skilled PT to address impairments and goals and improve overall function.   OBJECTIVE IMPAIRMENTS: decreased activity tolerance, decreased endurance, decreased mobility, difficulty walking, decreased ROM, decreased strength, hypomobility, and pain.   ACTIVITY LIMITATIONS: standing, stairs, and locomotion level  PARTICIPATION LIMITATIONS: running/playing with kids  PERSONAL FACTORS: 1-2 comorbidities: bilat feet pain, L knee chondromalacia are also affecting patient's functional outcome.   REHAB POTENTIAL: Good  CLINICAL DECISION MAKING: Stable/uncomplicated  EVALUATION COMPLEXITY: Low   GOALS:   SHORT TERM GOALS: Target date: 03/05/2024  Pt will be independent and compliant with HEP for improved ROM, pain, strength, and function. Baseline: Goal status: INITIAL  2.  Pt will demo improved R ankle AROM to at least 15 deg in DF, 25 deg in inversion, and 10-12 deg in eversion for improved ankle stiffness and mobility.  Baseline:  Goal status: INITIAL  3.  Pt will report at least a 25% improvement in tolerance with standing and ambulation.  Baseline:  Goal status: INITIAL  4.  Pt will demo symmetrical height with bilat heel raises and tolerate heel raises without increased pain for improved calf strength.  Baseline:  Goal status: INITIAL Target date:  03/19/2024  5.  Pt will be able to perform a 6 inch step up with good control and good form.  Baseline:  Goal status: INITIAL Target date:  03/12/2024   LONG TERM GOALS: Target date: 04/16/2024  Pt will demo 5/5 R ankle strength in DF, Eve, and Inv and tolerate good manual resistance in PF for improved strength and performance of functional mobility.  Baseline:  Goal status: INITIAL  2.  Pt will be  ambulating extended community distance without increased ankle pain and difficulty.   Baseline:  Goal status: INITIAL  3.  Pt will be able to ascend and descend stairs with a reciprocal gait without the rail.  Baseline:  Goal status: INITIAL  4.  Pt will report he is able to perform his normal standing activities and IADL's without significant pain and difficulty.  Baseline:  Goal status: INITIAL  5.  Pt will score 0-1 on the lateral step down test for improved eccentric control and performance of stairs.   Baseline:  Goal status: INITIAL    PLAN:  PT FREQUENCY: 1-2x/week  PT DURATION: other: 8-10 weeks  PLANNED INTERVENTIONS: 97164- PT Re-evaluation, 97750- Physical Performance Testing, 97110-Therapeutic exercises, 97530- Therapeutic activity, [id number]- Neuromuscular re-education, 97535- Self Care, [id number]- Manual therapy, [id number]- Gait training, [phone]- Aquatic Therapy, [phone]- Electrical stimulation (unattended), 97035- Ultrasound, 79439 (1-2 muscles), 20561 (3+ muscles)- Dry Needling, Patient/Family education, Balance training, Stair training, Taping, Joint mobilization, Scar mobilization, Cryotherapy, and Moist heat  PLAN FOR NEXT SESSION: review and perform HEP.  Cont with ankle ROM and ther ex.  Ice as needed.   Leigh Minerva III PT, DPT 02/15/2024 11:46 AM    "

## 2024-02-18 ENCOUNTER — Ambulatory Visit (HOSPITAL_BASED_OUTPATIENT_CLINIC_OR_DEPARTMENT_OTHER): Payer: Self-pay

## 2024-02-18 ENCOUNTER — Encounter (HOSPITAL_BASED_OUTPATIENT_CLINIC_OR_DEPARTMENT_OTHER): Payer: Self-pay

## 2024-02-18 DIAGNOSIS — M25571 Pain in right ankle and joints of right foot: Secondary | ICD-10-CM | POA: Diagnosis not present

## 2024-02-18 DIAGNOSIS — M6281 Muscle weakness (generalized): Secondary | ICD-10-CM

## 2024-02-18 DIAGNOSIS — M25671 Stiffness of right ankle, not elsewhere classified: Secondary | ICD-10-CM

## 2024-02-18 NOTE — Therapy (Signed)
 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   Allergy     pollen   Past Surgical History:  Procedure Laterality Date   EXCISION / CURETTAGE BONE CYST PHALANGES OF FOOT N/A    both left and right implants in both feet to correct flat feet   VASECTOMY  2019   Patient Active Problem List   Diagnosis Date Noted   Seasonal and perennial allergic rhinitis 07/29/2019   Allergic conjunctivitis 07/29/2019   Recurrent maxillary sinusitis 07/29/2019   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  Pt states his thighs are a little sore from his prior therapy.  Pt denies any adverse effects after prior treatment.  Pt reports having stiffness in ankle.  Pt states one of his scars feels like it has scar tissue build-up and MD agreed.  Pt saw MD and he was pleased.  Pt reports compliance with HEP and  has been performing scar tissue massage to portals.       PERTINENT HISTORY: R ankle surgery 12/07/23 bilat feet pain due to bilat flat feet.  Pt has a silicone implant in L ankle. Recent L foot plantar fasciitis which is doing much better now L knee chondromalacia, L knee pain  PAIN:   NPRS:  4/10 current Location:  R anterior and lateral ankle  Description:  Aggravating Factors:  walking and standing 10 mins, 1st thing in AM Easing:  sitting, resting, elevation  PRECAUTIONS: Other: per surgery    WEIGHT BEARING RESTRICTIONS: Yes WBAT  FALLS:  Has patient fallen in last 6 months? 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   PLOF: Independent  PATIENT GOALS: strengthening his foot, reduce stiffness, run a limited distance, return to using the elliptical  NEXT MD VISIT: 02/07/24  OBJECTIVE:  Note: Objective measures were completed at Evaluation unless otherwise noted.  DIAGNOSTIC FINDINGS: Pt is post op.  PATIENT SURVEYS:  LEFS:  40/80  COGNITION: Overall cognitive status: Within functional limits for tasks assessed     OBSERVATION: Portals healed well without any signs of infection.    LOWER EXTREMITY ROM:  ROM Right eval Left Eval AROM  Hip flexion    Hip extension    Hip abduction    Hip adduction    Hip internal rotation    Hip external rotation    Knee flexion    Knee extension    Ankle dorsiflexion AROM/PROM:  4/9 16  Ankle plantarflexion AROM 52 64  Ankle inversion AROM/PROM: 11/15 27  Ankle eversion AROM/PROM: 4/2 10   (Blank rows = not tested)    GAIT: Assistive device utilized: None Level of assistance: Complete Independence Comments: Pt ambulating with a heel to toe gait without AD.  Pt slightly favoring R LE at times though not limping.                                                                                                                                   TREATMENT:     02/18/24  Scifit bike seat 11 L4x6 minutes Ankle pumps x 20 reps Ankle circles cw and ccw Ankle Inv and Eve on towel Ankle Inv and Eve seated open chain  Ankle ABC's x 1 rep Seated heel raises 2x10 Seated toe raises 2x10 Seated short foot 5 2x10 BAPS board DF/PF and In/ev x15ea   Pt received R ankle PROM in DF, PF, Ev, and Inv per pt and tissue tolerance. Pt received scar tissue massage to lateral portals.   PT gave pt a HEP handout.  PT educated pt in correct form and appropriate frequency.  PT instructed pt to not perform into a painful range.    02/15/24  Reviewed pt presentation, HEP compliance, pain level, and response to prior treatment.  Scifit bike seat 11 L3-4x6 minutes Ankle pumps x 20 reps Ankle circles cw and ccw Ankle Inv and Eve on towel Ankle Inv and Eve seated open chain  Ankle ABC's x 1 rep Seated heel raises 2x10 Seated toe raises 2x10  Pt received R ankle PROM in DF, PF, Ev, and Inv per pt and tissue tolerance. Pt received scar tissue massage to lateral portals.   PT gave pt a HEP handout.  PT educated pt in correct form and appropriate frequency.  PT instructed pt to not perform into a painful range.    PATIENT EDUCATION:  Education details: dx, relevant anatomy, exercise form, HEP, rationale of interventions, and POC.  PT answered pt's questions.  Person educated: Patient Education method: Explanation, Demonstration, Tactile cues, Verbal cues, and Handouts Education comprehension: verbalized understanding, returned demonstration, verbal cues required, tactile cues required, and needs further education  HOME EXERCISE PROGRAM: Access Code: [email or web address] URL: [email or web address] Date: 02/06/2024 Prepared by: Mose Minerva  Exercises - ANKLE PUMPS  - 2-3 x daily - 7 x weekly - 2 sets - 10 reps - Seated Ankle Circles  - 2 x  daily - 7 x weekly - 2 sets - 10 reps - Supine Active Straight Leg Raise  - 1 x daily - 7 x weekly -  2 sets - 10 reps  Updated HEP: - Supine Ankle Inversion Eversion AROM  - 2 x daily - 7 x weekly - 2 sets - 10 reps - Seated Heel Raise  - 1 x daily - 7 x weekly - 2 sets - 10 reps - Seated Toe Raise  - 1 x daily - 7 x weekly - 2 sets - 10 reps  CLINICAL IMPRESSION:  Pt demonstrates excellent tolerance for ROM interventions. Added BAPS board for NMR as well as short foot exercise to strengthen intrinsic foot mm. He felt muscular fatigue with these but no discomfort. Added short foot exercise to HEP. Will continue to progress as tolerated.    OBJECTIVE IMPAIRMENTS: decreased activity tolerance, decreased endurance, decreased mobility, difficulty walking, decreased ROM, decreased strength, hypomobility, and pain.   ACTIVITY LIMITATIONS: standing, stairs, and locomotion level  PARTICIPATION LIMITATIONS: running/playing with kids  PERSONAL FACTORS: 1-2 comorbidities: bilat feet pain, L knee chondromalacia are also affecting patient's functional outcome.   REHAB POTENTIAL: Good  CLINICAL DECISION MAKING: Stable/uncomplicated  EVALUATION COMPLEXITY: Low   GOALS:   SHORT TERM GOALS: Target date: 03/05/2024  Pt will be independent and compliant with HEP for improved ROM, pain, strength, and function. Baseline: Goal status: INITIAL  2.  Pt will demo improved R ankle AROM to at least 15 deg in DF, 25 deg in inversion, and 10-12 deg in eversion for improved ankle stiffness and mobility.  Baseline:  Goal status: INITIAL  3.  Pt will report at least a 25% improvement in tolerance with standing and ambulation.  Baseline:  Goal status: INITIAL  4.  Pt will demo symmetrical height with bilat heel raises and tolerate heel raises without increased pain for improved calf strength.  Baseline:  Goal status: INITIAL Target date:  03/19/2024  5.  Pt will be able to perform a 6 inch step up with  good control and good form.  Baseline:  Goal status: INITIAL Target date:  03/12/2024   LONG TERM GOALS: Target date: 04/16/2024  Pt will demo 5/5 R ankle strength in DF, Eve, and Inv and tolerate good manual resistance in PF for improved strength and performance of functional mobility.  Baseline:  Goal status: INITIAL  2.  Pt will be ambulating extended community distance without increased ankle pain and difficulty.   Baseline:  Goal status: INITIAL  3.  Pt will be able to ascend and descend stairs with a reciprocal gait without the rail.  Baseline:  Goal status: INITIAL  4.  Pt will report he is able to perform his normal standing activities and IADL's without significant pain and difficulty.  Baseline:  Goal status: INITIAL  5.  Pt will score 0-1 on the lateral step down test for improved eccentric control and performance of stairs.   Baseline:  Goal status: INITIAL    PLAN:  PT FREQUENCY: 1-2x/week  PT DURATION: other: 8-10 weeks  PLANNED INTERVENTIONS: 97164- PT Re-evaluation, 97750- Physical Performance Testing, 97110-Therapeutic exercises, 97530- Therapeutic activity, [id number]- Neuromuscular re-education, 97535- Self Care, 02859- Manual therapy, [id number]- Gait training, [phone]- Aquatic Therapy, [phone]- Electrical stimulation (unattended), 97035- Ultrasound, 79439 (1-2 muscles), 20561 (3+ muscles)- Dry Needling, Patient/Family education, Balance training, Stair training, Taping, Joint mobilization, Scar mobilization, Cryotherapy, and Moist heat  PLAN FOR NEXT SESSION: review and perform HEP.  Cont with ankle ROM and ther ex.  Ice as needed.  Asberry Rodes, PTA  02/18/2024 3:26 PM    "

## 2024-02-26 ENCOUNTER — Ambulatory Visit (HOSPITAL_BASED_OUTPATIENT_CLINIC_OR_DEPARTMENT_OTHER): Admitting: Physical Therapy

## 2024-02-26 ENCOUNTER — Encounter (HOSPITAL_BASED_OUTPATIENT_CLINIC_OR_DEPARTMENT_OTHER): Payer: Self-pay | Admitting: Physical Therapy

## 2024-02-26 DIAGNOSIS — M6281 Muscle weakness (generalized): Secondary | ICD-10-CM

## 2024-02-26 DIAGNOSIS — M25571 Pain in right ankle and joints of right foot: Secondary | ICD-10-CM

## 2024-02-26 DIAGNOSIS — M25671 Stiffness of right ankle, not elsewhere classified: Secondary | ICD-10-CM

## 2024-02-26 NOTE — Therapy (Signed)
 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   Allergy     pollen   Past Surgical History:  Procedure Laterality Date   EXCISION / CURETTAGE BONE CYST PHALANGES OF FOOT N/A    both left and right implants in both feet to correct flat feet   VASECTOMY  2019   Patient Active Problem List   Diagnosis Date Noted   Seasonal and perennial allergic rhinitis 07/29/2019   Allergic conjunctivitis 07/29/2019   Recurrent maxillary sinusitis 07/29/2019   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   Hyperlipidemia 08/07/2018     REFERRING PROVIDER: Silva Juliene SAUNDERS, DPM   REFERRING DIAG: [phone] (ICD-10-CM) - Other synovitis and tenosynovitis, right ankle and foot M21.41,M21.42 (ICD-10-CM) - Pes planus of both feet M72.2 (ICD-10-CM) - Plantar fasciitis of right foot  THERAPY DIAG:  Pain in right ankle and joints of right foot  Stiffness of right ankle, not elsewhere classified  Muscle weakness (generalized)  Rationale for Evaluation and Treatment: Rehabilitation  ONSET DATE: DOS 12/07/2023  SUBJECTIVE:   SUBJECTIVE STATEMENT:    Feeling pretty good today, have been feeling alright after PT sessions. Haven't felt as much stiffness over the past week or so, definitely making progress. Having more issues with my left than my right foot.     PERTINENT HISTORY: R ankle surgery 12/07/23 bilat feet pain due to bilat flat feet.  Pt has a silicone  implant in L ankle. Recent L foot plantar fasciitis which is doing much better now L knee chondromalacia, L knee pain  PAIN:   NPRS:  0/10 current; 3/10 at worst past week when walking/being up on feet for a long time  Location:  R anterior and lateral ankle  Description:  Aggravating Factors:  walking and standing 10 mins, 1st thing in AM Easing:  sitting, resting, elevation  PRECAUTIONS: Other: per surgery    WEIGHT BEARING RESTRICTIONS: Yes WBAT  FALLS:  Has patient fallen in last 6 months? 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   PLOF: Independent  PATIENT GOALS: strengthening his foot, reduce stiffness, run a limited distance, return to using the elliptical  NEXT MD VISIT: 02/07/24  OBJECTIVE:  Note: Objective measures were completed at Evaluation unless otherwise noted.  DIAGNOSTIC FINDINGS: Pt is post op.  PATIENT SURVEYS:  LEFS:  40/80  COGNITION: Overall cognitive status: Within functional limits for tasks assessed     OBSERVATION: Portals healed well without any signs of infection.    LOWER EXTREMITY ROM:   ROM Right eval Left Eval AROM 02/26/24 R AROM 02/26/24 L AROM   Hip flexion      Hip extension  Hip abduction      Hip adduction      Hip internal rotation      Hip external rotation      Knee flexion      Knee extension      Ankle dorsiflexion AROM/PROM:  4/9 16 12* 12*  Ankle plantarflexion AROM 52 64 60* 68*  Ankle inversion AROM/PROM: 11/15 27 22* 27*  Ankle eversion AROM/PROM: 4/2 10 8* 13*   (Blank rows = not tested)    GAIT: Assistive device utilized: None Level of assistance: Complete Independence Comments: Pt ambulating with a heel to toe gait without AD.  Pt slightly favoring R LE at times though not limping.                                                                                                                                   TREATMENT:     02/26/24  Scifit bike L4x6 minutes for w/u  ROM update    Gastroc stretching B, general PROM B, ankle DF mobs   BAPS board attachment #3, PF/DF x20, inversion/eversion x20, circles x10 CW/CCW Red TB 4 way ankle x10 B     02/18/24  Scifit bike seat 11 L4x6 minutes Ankle pumps x 20 reps Ankle circles cw and ccw Ankle Inv and Eve on towel Ankle Inv and Eve seated open chain  Ankle ABC's x 1 rep Seated heel raises 2x10 Seated toe raises 2x10 Seated short foot 5 2x10 BAPS board DF/PF and In/ev x15ea   Pt received R ankle PROM in DF, PF, Ev, and Inv per pt and tissue tolerance. Pt received scar tissue massage to lateral portals.   PT gave pt a HEP handout.  PT educated pt in correct form and appropriate frequency.  PT instructed pt to not perform into a painful range.    02/15/24  Reviewed pt presentation, HEP compliance, pain level, and response to prior treatment.  Scifit bike seat 11 L3-4x6 minutes Ankle pumps x 20 reps Ankle circles cw and ccw Ankle Inv and Eve on towel Ankle Inv and Eve seated open chain  Ankle ABC's x 1 rep Seated heel raises 2x10 Seated toe raises 2x10  Pt received R ankle PROM in DF, PF, Ev, and Inv per pt and tissue tolerance. Pt received scar tissue massage to lateral portals.   PT gave pt a HEP handout.  PT educated pt in correct form and appropriate frequency.  PT instructed pt to not perform into a painful range.    PATIENT EDUCATION:  Education details: dx, relevant anatomy, exercise form, HEP, rationale of interventions, and POC.  PT answered pt's questions.  Person educated: Patient Education method: Explanation, Demonstration, Tactile cues, Verbal cues, and Handouts Education comprehension: verbalized understanding, returned demonstration, verbal cues required, tactile cues required, and needs further education  HOME EXERCISE  PROGRAM: Access Code: [email or web address] URL: [email or web address] Date: 02/06/2024 Prepared by: Mose Minerva  Exercises - ANKLE  PUMPS  - 2-3 x daily - 7 x weekly - 2 sets - 10 reps - Seated Ankle Circles  - 2 x daily - 7 x weekly - 2 sets - 10 reps - Supine Active Straight Leg Raise  - 1 x daily - 7 x weekly - 2 sets - 10 reps  Updated HEP: - Supine Ankle Inversion Eversion AROM  - 2 x daily - 7 x weekly - 2 sets - 10 reps - Seated Heel Raise  - 1 x daily - 7 x weekly - 2 sets - 10 reps - Seated Toe Raise  - 1 x daily - 7 x weekly - 2 sets - 10 reps  CLINICAL IMPRESSION:  Patient arrives doing well, we updated ROM and goals today, otherwise continued working on ROM and functional strength. Doing well, AROM in R ankle is coming along great and he was able to tolerate progression into light strengthening today without issue. Will continue to progress as tolerated.    OBJECTIVE IMPAIRMENTS: decreased activity tolerance, decreased endurance, decreased mobility, difficulty walking, decreased ROM, decreased strength, hypomobility, and pain.   ACTIVITY LIMITATIONS: standing, stairs, and locomotion level  PARTICIPATION LIMITATIONS: running/playing with kids  PERSONAL FACTORS: 1-2 comorbidities: bilat feet pain, L knee chondromalacia are also affecting patient's functional outcome.   REHAB POTENTIAL: Good  CLINICAL DECISION MAKING: Stable/uncomplicated  EVALUATION COMPLEXITY: Low   GOALS:   SHORT TERM GOALS: Target date: 03/05/2024  Pt will be independent and compliant with HEP for improved ROM, pain, strength, and function. Baseline: Goal status: INITIAL  2.  Pt will demo improved R ankle AROM to at least 15 deg in DF, 25 deg in inversion, and 10-12 deg in eversion for improved ankle stiffness and mobility.  Baseline:  Goal status: INITIAL  3.  Pt will report at least a 25% improvement in tolerance with standing and ambulation.  Baseline:  Goal status: INITIAL  4.   Pt will demo symmetrical height with bilat heel raises and tolerate heel raises without increased pain for improved calf strength.  Baseline:  Goal status: INITIAL Target date:  03/19/2024  5.  Pt will be able to perform a 6 inch step up with good control and good form.  Baseline:  Goal status: INITIAL Target date:  03/12/2024   LONG TERM GOALS: Target date: 04/16/2024  Pt will demo 5/5 R ankle strength in DF, Eve, and Inv and tolerate good manual resistance in PF for improved strength and performance of functional mobility.  Baseline:  Goal status: INITIAL  2.  Pt will be ambulating extended community distance without increased ankle pain and difficulty.   Baseline:  Goal status: INITIAL  3.  Pt will be able to ascend and descend stairs with a reciprocal gait without the rail.  Baseline:  Goal status: INITIAL  4.  Pt will report he is able to perform his normal standing activities and IADL's without significant pain and difficulty.  Baseline:  Goal status: INITIAL  5.  Pt will score 0-1 on the lateral step down test for improved eccentric control and performance of stairs.   Baseline:  Goal status: INITIAL    PLAN:  PT FREQUENCY: 1-2x/week  PT DURATION: other: 8-10 weeks  PLANNED INTERVENTIONS: 97164- PT Re-evaluation, 97750- Physical Performance Testing, 97110-Therapeutic exercises, 97530- Therapeutic activity, [id number]- Neuromuscular re-education, 97535- Self Care, 02859- Manual therapy, [id number]- Gait training, [phone]- Aquatic Therapy, [phone]- Electrical stimulation (unattended), 97035- Ultrasound, 79439 (1-2 muscles), [phone] (3+ muscles)- Dry Needling, Patient/Family education, Balance training,  Stair training, Taping, Joint mobilization, Scar mobilization, Cryotherapy, and Moist heat  PLAN FOR NEXT SESSION: review and perform HEP.  Cont with ankle ROM and ther ex.  Ice as needed. Progress strength as tolerated. Monitor lateral L ankle pain (reports this has been more problematic  than the right)    Josette Rough, PT, DPT 02/26/2024 2:27 PM     "

## 2024-02-29 ENCOUNTER — Ambulatory Visit (HOSPITAL_BASED_OUTPATIENT_CLINIC_OR_DEPARTMENT_OTHER): Payer: Self-pay | Attending: Podiatry | Admitting: Physical Therapy

## 2024-02-29 ENCOUNTER — Encounter (HOSPITAL_BASED_OUTPATIENT_CLINIC_OR_DEPARTMENT_OTHER): Payer: Self-pay | Admitting: Physical Therapy

## 2024-02-29 DIAGNOSIS — M25671 Stiffness of right ankle, not elsewhere classified: Secondary | ICD-10-CM | POA: Insufficient documentation

## 2024-02-29 DIAGNOSIS — M6281 Muscle weakness (generalized): Secondary | ICD-10-CM | POA: Insufficient documentation

## 2024-02-29 DIAGNOSIS — M25571 Pain in right ankle and joints of right foot: Secondary | ICD-10-CM | POA: Insufficient documentation

## 2024-02-29 NOTE — Therapy (Signed)
 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   Allergy     pollen   Past Surgical History:  Procedure Laterality Date   EXCISION / CURETTAGE BONE CYST PHALANGES OF FOOT N/A    both left and right implants in both feet to correct flat feet   VASECTOMY  2019   Patient Active Problem List   Diagnosis Date Noted   Seasonal and perennial allergic rhinitis 07/29/2019   Allergic conjunctivitis 07/29/2019   Recurrent maxillary sinusitis 07/29/2019   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 Stiffness is improving.     PERTINENT HISTORY: R ankle surgery 12/07/23 bilat feet pain due to bilat flat feet.  Pt has a silicone implant in L ankle. Recent L foot plantar fasciitis which is doing much better now L knee chondromalacia, L knee pain  PAIN:   NPRS:  0/10  current; 3/10 at worst past week when walking/being up on feet for a long time  Location:  R anterior and lateral ankle  Description:  Aggravating Factors:  walking and standing 10 mins, 1st thing in AM Easing:  sitting, resting, elevation  PRECAUTIONS: Other: per surgery    WEIGHT BEARING RESTRICTIONS: Yes WBAT  FALLS:  Has patient fallen in last 6 months? 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   PLOF: Independent  PATIENT GOALS: strengthening his foot, reduce stiffness, run a limited distance, return to using the elliptical  NEXT MD VISIT: 02/07/24  OBJECTIVE:  Note: Objective measures were completed at Evaluation unless otherwise noted.  DIAGNOSTIC FINDINGS: Pt is post op.  PATIENT SURVEYS:  LEFS:  40/80  COGNITION: Overall cognitive status: Within functional limits for tasks assessed     OBSERVATION: Portals healed well without any signs of infection.    LOWER EXTREMITY ROM:   ROM Right eval Left Eval AROM 02/26/24 R AROM 02/26/24 L AROM   Hip flexion      Hip extension      Hip abduction      Hip adduction      Hip internal rotation      Hip  external rotation      Knee flexion      Knee extension      Ankle dorsiflexion AROM/PROM:  4/9 16 12* 12*  Ankle plantarflexion AROM 52 64 60* 68*  Ankle inversion AROM/PROM: 11/15 27 22* 27*  Ankle eversion AROM/PROM: 4/2 10 8* 13*   (Blank rows = not tested)    GAIT: Assistive device utilized: None Level of assistance: Complete Independence Comments: Pt ambulating with a heel to toe gait without AD.  Pt slightly favoring R LE at times though not limping.                                                                                                                                  TREATMENT:   02/29/24 BAPS board attachment #3, PF/DF x20,  inversion/eversion x20, circles x20 CW/CCW Seated foot doming 2 x 10 Standing heel raises 2 x 10 Standing toe raises 2 x 10 SLS on airex 5 x 20 second holds Step up 6 inch 2 x 15 Lateral step up 6 inch 2 x 15 Lateral step down 6 inch 1 x 10  02/26/24  Scifit bike L4x6 minutes for w/u  ROM update    Gastroc stretching B, general PROM B, ankle DF mobs   BAPS board attachment #3, PF/DF x20, inversion/eversion x20, circles x10 CW/CCW Red TB 4 way ankle x10 B     02/18/24  Scifit bike seat 11 L4x6 minutes Ankle pumps x 20 reps Ankle circles cw and ccw Ankle Inv and Eve on towel Ankle Inv and Eve seated open chain  Ankle ABC's x 1 rep Seated heel raises 2x10 Seated toe raises 2x10 Seated short foot 5 2x10 BAPS board DF/PF and In/ev x15ea   Pt received R ankle PROM in DF, PF, Ev, and Inv per pt and tissue tolerance. Pt received scar tissue massage to lateral portals.   PT gave pt a HEP handout.  PT educated pt in correct form and appropriate frequency.  PT instructed pt to not perform into a painful range.    02/15/24  Reviewed pt presentation, HEP compliance, pain level, and response to prior treatment.  Scifit bike seat 11 L3-4x6 minutes Ankle pumps x 20 reps Ankle circles cw and ccw Ankle Inv and Eve on towel Ankle Inv and Eve seated open chain  Ankle ABC's x 1 rep Seated heel raises 2x10 Seated toe raises 2x10  Pt received R ankle PROM in DF, PF, Ev, and Inv per pt and tissue tolerance. Pt received scar tissue massage to lateral portals.   PT gave pt a HEP handout.  PT educated pt in correct form and appropriate frequency.  PT instructed pt to not perform into a painful range.    PATIENT EDUCATION:  Education details: dx, relevant anatomy, exercise form, HEP, rationale of interventions, and POC.  PT answered pt's questions.  Person educated: Patient Education method: Explanation, Demonstration, Tactile cues, Verbal cues, and  Handouts Education  comprehension: verbalized understanding, returned demonstration, verbal cues required, tactile cues required, and needs further education  HOME EXERCISE PROGRAM: Access Code: [email or web address] URL: [email or web address] Date: 02/06/2024 Prepared by: Mose Minerva  Exercises - ANKLE PUMPS  - 2-3 x daily - 7 x weekly - 2 sets - 10 reps - Seated Ankle Circles  - 2 x daily - 7 x weekly - 2 sets - 10 reps - Supine Active Straight Leg Raise  - 1 x daily - 7 x weekly - 2 sets - 10 reps  Updated HEP: - Supine Ankle Inversion Eversion AROM  - 2 x daily - 7 x weekly - 2 sets - 10 reps - Seated Heel Raise  - 1 x daily - 7 x weekly - 2 sets - 10 reps - Seated Toe Raise  - 1 x daily - 7 x weekly - 2 sets - 10 reps  CLINICAL IMPRESSION:  Patient overall progressing very well. Progressed into additional standing exercises today which are tolerated well. Will continue to progress to higher level balance and functional strengthening. Possibly begin elliptical in coming sessions and eccentric strengthening if tolerating progression from today well.  Patient will continue to benefit from physical therapy in order to improve function and reduce impairment.    OBJECTIVE IMPAIRMENTS: decreased activity tolerance, decreased endurance, decreased mobility, difficulty walking, decreased ROM, decreased strength, hypomobility, and pain.   ACTIVITY LIMITATIONS: standing, stairs, and locomotion level  PARTICIPATION LIMITATIONS: running/playing with kids  PERSONAL FACTORS: 1-2 comorbidities: bilat feet pain, L knee chondromalacia are also affecting patient's functional outcome.   REHAB POTENTIAL: Good  CLINICAL DECISION MAKING: Stable/uncomplicated  EVALUATION COMPLEXITY: Low   GOALS:   SHORT TERM GOALS: Target date: 03/05/2024  Pt will be independent and compliant with HEP for improved ROM, pain, strength, and function. Baseline: Goal status: INITIAL  2.  Pt will demo improved R ankle AROM to at  least 15 deg in DF, 25 deg in inversion, and 10-12 deg in eversion for improved ankle stiffness and mobility.  Baseline:  Goal status: INITIAL  3.  Pt will report at least a 25% improvement in tolerance with standing and ambulation.  Baseline:  Goal status: INITIAL  4.  Pt will demo symmetrical height with bilat heel raises and tolerate heel raises without increased pain for improved calf strength.  Baseline:  Goal status: INITIAL Target date:  03/19/2024  5.  Pt will be able to perform a 6 inch step up with good control and good form.  Baseline:  Goal status: INITIAL Target date:  03/12/2024   LONG TERM GOALS: Target date: 04/16/2024  Pt will demo 5/5 R ankle strength in DF, Eve, and Inv and tolerate good manual resistance in PF for improved strength and performance of functional mobility.  Baseline:  Goal status: INITIAL  2.  Pt will be ambulating extended community distance without increased ankle pain and difficulty.   Baseline:  Goal status: INITIAL  3.  Pt will be able to ascend and descend stairs with a reciprocal gait without the rail.  Baseline:  Goal status: INITIAL  4.  Pt will report he is able to perform his normal standing activities and IADL's without significant pain and difficulty.  Baseline:  Goal status: INITIAL  5.  Pt will score 0-1 on the lateral step down test for improved eccentric control and performance of stairs.   Baseline:  Goal status: INITIAL    PLAN:  PT FREQUENCY: 1-2x/week  PT DURATION: other: 8-10 weeks  PLANNED INTERVENTIONS: 97164- PT Re-evaluation, 97750- Physical Performance Testing, 97110-Therapeutic exercises, 97530- Therapeutic activity, [id number]- Neuromuscular re-education, [phone]- Self Care, 02859- Manual therapy, [phone]- Gait training, [phone]- Aquatic Therapy, [phone]- Electrical stimulation (unattended), 97035- Ultrasound, 20560 (1-2 muscles), 20561 (3+ muscles)- Dry Needling, Patient/Family education, Balance training, Stair  training, Taping, Joint mobilization, Scar mobilization, Cryotherapy, and Moist heat  PLAN FOR NEXT SESSION: R ankle/foot strength, functional strengthening     Prentice RAMAN Saturnino Liew, PT 02/29/2024, 8:07 AM     "

## 2024-03-03 ENCOUNTER — Encounter (HOSPITAL_BASED_OUTPATIENT_CLINIC_OR_DEPARTMENT_OTHER): Payer: Self-pay

## 2024-03-03 ENCOUNTER — Ambulatory Visit (HOSPITAL_BASED_OUTPATIENT_CLINIC_OR_DEPARTMENT_OTHER)

## 2024-03-03 DIAGNOSIS — M25671 Stiffness of right ankle, not elsewhere classified: Secondary | ICD-10-CM

## 2024-03-03 DIAGNOSIS — M6281 Muscle weakness (generalized): Secondary | ICD-10-CM

## 2024-03-03 DIAGNOSIS — M25571 Pain in right ankle and joints of right foot: Secondary | ICD-10-CM | POA: Diagnosis not present

## 2024-03-03 NOTE — Therapy (Signed)
 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   Allergy     pollen   Past Surgical History:  Procedure Laterality Date   EXCISION / CURETTAGE BONE CYST PHALANGES OF FOOT N/A    both left and right implants in both feet to correct flat feet   VASECTOMY  2019   Patient Active Problem List   Diagnosis Date Noted   Seasonal and perennial allergic rhinitis 07/29/2019   Allergic conjunctivitis 07/29/2019   Recurrent maxillary sinusitis 07/29/2019   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 Stiffness is improving.     PERTINENT HISTORY: R ankle surgery 12/07/23 bilat feet pain due to bilat flat feet.  Pt has a silicone implant in L ankle. Recent L foot plantar fasciitis which is doing much better now L knee chondromalacia, L knee pain  PAIN:   NPRS:   0/10 current; 3/10 at worst past week when walking/being up on feet for a long time  Location:  R anterior and lateral ankle  Description:  Aggravating Factors:  walking and standing 10 mins, 1st thing in AM Easing:  sitting, resting, elevation  PRECAUTIONS: Other: per surgery    WEIGHT BEARING RESTRICTIONS: Yes WBAT  FALLS:  Has patient fallen in last 6 months? 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   PLOF: Independent  PATIENT GOALS: strengthening his foot, reduce stiffness, run a limited distance, return to using the elliptical  NEXT MD VISIT: 04/10/24  OBJECTIVE:  Note: Objective measures were completed at Evaluation unless otherwise noted.  DIAGNOSTIC FINDINGS: Pt is post op.  PATIENT SURVEYS:  LEFS:  40/80  COGNITION: Overall cognitive status: Within functional limits for tasks assessed     OBSERVATION: Portals healed well without any signs of infection.    LOWER EXTREMITY ROM:   ROM Right eval Left Eval AROM 02/26/24 R AROM 02/26/24 L AROM   Hip flexion      Hip extension      Hip abduction      Hip adduction      Hip internal rotation  Hip external rotation      Knee flexion      Knee extension      Ankle dorsiflexion AROM/PROM:  4/9 16 12* 12*  Ankle plantarflexion AROM 52 64 60* 68*  Ankle inversion AROM/PROM: 11/15 27 22* 27*  Ankle eversion AROM/PROM: 4/2 10 8* 13*   (Blank rows = not tested)    GAIT: Assistive device utilized: None Level of assistance: Complete Independence Comments: Pt ambulating with a heel to toe gait without AD.  Pt slightly favoring R LE at times though not limping.                                                                                                                                  TREATMENT:    03/03/24 Sci fit bike L4 x36min Standing calf  stretch 30sec x3 ea Standing arch doming SLS with foot doming and knee bends 2x10  BAPS board attachment #3, PF/DF x20, inversion/eversion x20, circles x20 CW/CCW Standing heel raises 2 x 10 Standing heel raises with ER 2x10 Standing toe raises 2 x 10 Eccentric HR 2x10 Marching (slow) on airex no UE support 2x10 SL hip hinge 2x5ea Tandem balance 2x20sec each Step up 8inch with cues for slow eccentric control (pause SLS at top for balance) 2x10 Seated foot adduction 2x10 RTB Squats 2x10  02/29/24 BAPS board attachment #3, PF/DF x20, inversion/eversion x20, circles x20 CW/CCW Seated foot doming 2 x 10 Standing heel raises 2 x 10 Standing toe raises 2 x 10 SLS on airex 5 x 20 second holds Step up 6 inch 2 x 15 Lateral step up 6 inch 2 x 15 Lateral step down 6 inch 1 x 10  02/26/24  Scifit bike L4x6 minutes for w/u  ROM update    Gastroc stretching B, general PROM B, ankle DF mobs   BAPS board attachment #3, PF/DF x20, inversion/eversion x20, circles x10 CW/CCW Red TB 4 way ankle x10 B     02/18/24  Scifit bike seat 11 L4x6 minutes Ankle pumps x 20 reps Ankle circles cw and ccw Ankle Inv and Eve on towel Ankle Inv and Eve seated open chain  Ankle ABC's x 1 rep Seated heel raises 2x10 Seated toe raises 2x10 Seated short foot 5 2x10 BAPS board DF/PF and In/ev x15ea   Pt received R ankle PROM in DF, PF, Ev, and Inv per pt and tissue tolerance. Pt received scar tissue massage to lateral portals.   PT gave pt a HEP handout.  PT educated pt in correct form and appropriate frequency.  PT instructed pt to not perform into a painful range.    02/15/24  Reviewed pt presentation, HEP compliance, pain level, and response to prior treatment.  Scifit bike seat 11 L3-4x6 minutes Ankle pumps x 20 reps Ankle circles cw and ccw Ankle Inv and Eve on towel Ankle Inv and Eve seated open chain  Ankle ABC's x 1 rep Seated  heel raises 2x10 Seated toe raises 2x10  Pt  received R ankle PROM in DF, PF, Ev, and Inv per pt and tissue tolerance. Pt received scar tissue massage to lateral portals.   PT gave pt a HEP handout.  PT educated pt in correct form and appropriate frequency.  PT instructed pt to not perform into a painful range.    PATIENT EDUCATION:  Education details: dx, relevant anatomy, exercise form, HEP, rationale of interventions, and POC.  PT answered pt's questions.  Person educated: Patient Education method: Explanation, Demonstration, Tactile cues, Verbal cues, and Handouts Education comprehension: verbalized understanding, returned demonstration, verbal cues required, tactile cues required, and needs further education  HOME EXERCISE PROGRAM: Access Code: [email or web address] URL: [email or web address] Date: 02/06/2024 Prepared by: Mose Minerva  Exercises - ANKLE PUMPS  - 2-3 x daily - 7 x weekly - 2 sets - 10 reps - Seated Ankle Circles  - 2 x daily - 7 x weekly - 2 sets - 10 reps - Supine Active Straight Leg Raise  - 1 x daily - 7 x weekly - 2 sets - 10 reps  Updated HEP: - Supine Ankle Inversion Eversion AROM  - 2 x daily - 7 x weekly - 2 sets - 10 reps - Seated Heel Raise  - 1 x daily - 7 x weekly - 2 sets - 10 reps - Seated Toe Raise  - 1 x daily - 7 x weekly - 2 sets - 10 reps  CLINICAL IMPRESSION:  Continued to work on AUTOMATIC DATA, intrinsic/extrinsic foot strengthening with good tolerance. He reported muscular fatigue with externally rotated HR. Added resisted foot adduction to target posterior tib m as he demonstrates poor endurance and strength here. Pt has tendency to shift weight to lateral R foot in SL positions. Advised in corrections for this during session. Will continue to work on stability and strengthening as tolerated.     OBJECTIVE IMPAIRMENTS: decreased activity tolerance, decreased endurance, decreased mobility, difficulty walking, decreased ROM, decreased strength, hypomobility, and pain.   ACTIVITY LIMITATIONS:  standing, stairs, and locomotion level  PARTICIPATION LIMITATIONS: running/playing with kids  PERSONAL FACTORS: 1-2 comorbidities: bilat feet pain, L knee chondromalacia are also affecting patient's functional outcome.   REHAB POTENTIAL: Good  CLINICAL DECISION MAKING: Stable/uncomplicated  EVALUATION COMPLEXITY: Low   GOALS:   SHORT TERM GOALS: Target date: 03/05/2024  Pt will be independent and compliant with HEP for improved ROM, pain, strength, and function. Baseline: Goal status: MET 1/5  2.  Pt will demo improved R ankle AROM to at least 15 deg in DF, 25 deg in inversion, and 10-12 deg in eversion for improved ankle stiffness and mobility.  Baseline:  Goal status: IN PROGRESS 1/5  3.  Pt will report at least a 25% improvement in tolerance with standing and ambulation.  Baseline:  Goal status: MET 1/5  4.  Pt will demo symmetrical height with bilat heel raises and tolerate heel raises without increased pain for improved calf strength.  Baseline:  Goal status: INITIAL Target date:  03/19/2024  5.  Pt will be able to perform a 6 inch step up with good control and good form.  Baseline:  Goal status: INITIAL Target date:  03/12/2024   LONG TERM GOALS: Target date: 04/16/2024  Pt will demo 5/5 R ankle strength in DF, Eve, and Inv and tolerate good manual resistance in PF for improved strength and performance of functional mobility.  Baseline:  Goal status: INITIAL  2.  Pt will be ambulating  extended community distance without increased ankle pain and difficulty.   Baseline:  Goal status: INITIAL  3.  Pt will be able to ascend and descend stairs with a reciprocal gait without the rail.  Baseline:  Goal status: INITIAL  4.  Pt will report he is able to perform his normal standing activities and IADL's without significant pain and difficulty.  Baseline:  Goal status: INITIAL  5.  Pt will score 0-1 on the lateral step down test for improved eccentric control and  performance of stairs.   Baseline:  Goal status: INITIAL    PLAN:  PT FREQUENCY: 1-2x/week  PT DURATION: other: 8-10 weeks  PLANNED INTERVENTIONS: 97164- PT Re-evaluation, 97750- Physical Performance Testing, 97110-Therapeutic exercises, 97530- Therapeutic activity, 97112- Neuromuscular re-education, 97535- Self Care, 02859- Manual therapy, [phone]- Gait training, [phone]- Aquatic Therapy, [phone]- Electrical stimulation (unattended), 97035- Ultrasound, 79439 (1-2 muscles), [phone] (3+ muscles)- Dry Needling, Patient/Family education, Balance training, Stair training, Taping, Joint mobilization, Scar mobilization, Cryotherapy, and Moist heat  PLAN FOR NEXT SESSION: R ankle/foot strength, functional strengthening     Asberry BRAVO Adithya Difrancesco, PTA 03/03/2024, 2:01 PM     "

## 2024-03-06 ENCOUNTER — Other Ambulatory Visit

## 2024-03-07 ENCOUNTER — Ambulatory Visit (HOSPITAL_BASED_OUTPATIENT_CLINIC_OR_DEPARTMENT_OTHER)

## 2024-03-07 ENCOUNTER — Ambulatory Visit: Admitting: Podiatrist

## 2024-03-07 ENCOUNTER — Encounter (HOSPITAL_BASED_OUTPATIENT_CLINIC_OR_DEPARTMENT_OTHER): Payer: Self-pay

## 2024-03-07 DIAGNOSIS — M216X2 Other acquired deformities of left foot: Secondary | ICD-10-CM | POA: Diagnosis not present

## 2024-03-07 DIAGNOSIS — M25571 Pain in right ankle and joints of right foot: Secondary | ICD-10-CM | POA: Diagnosis not present

## 2024-03-07 DIAGNOSIS — M216X1 Other acquired deformities of right foot: Secondary | ICD-10-CM | POA: Diagnosis not present

## 2024-03-07 DIAGNOSIS — M25671 Stiffness of right ankle, not elsewhere classified: Secondary | ICD-10-CM

## 2024-03-07 DIAGNOSIS — M6281 Muscle weakness (generalized): Secondary | ICD-10-CM

## 2024-03-07 NOTE — Therapy (Signed)
 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   Allergy     pollen   Past Surgical History:  Procedure Laterality Date   EXCISION / CURETTAGE BONE CYST PHALANGES OF FOOT N/A    both left and right implants in both feet to correct flat feet   VASECTOMY  2019   Patient Active Problem List   Diagnosis Date Noted   Seasonal and perennial allergic rhinitis 07/29/2019   Allergic conjunctivitis 07/29/2019   Recurrent maxillary sinusitis 07/29/2019   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   Hyperlipidemia 08/07/2018     REFERRING PROVIDER: Silva Juliene SAUNDERS, DPM   REFERRING DIAG: [phone] (ICD-10-CM) - Other synovitis and tenosynovitis, right ankle and foot M21.41,M21.42 (ICD-10-CM) - Pes planus of both feet M72.2 (ICD-10-CM) - Plantar fasciitis of right foot  THERAPY DIAG:  Pain in right ankle and joints of right foot  Stiffness of right ankle, not elsewhere classified  Muscle weakness (generalized)  Rationale for Evaluation and Treatment: Rehabilitation  ONSET DATE: DOS 12/07/2023  SUBJECTIVE:   SUBJECTIVE STATEMENT:  Pt reports some achilles area discomfort, especially with standing heel raises. Pt was fit for orthotics at [facility] this morning. They should arrive in 2-3 weeks.       PERTINENT HISTORY: R ankle surgery 12/07/23 bilat feet pain due to bilat flat feet.  Pt has a silicone implant in L ankle. Recent L foot  plantar fasciitis which is doing much better now L knee chondromalacia, L knee pain  PAIN:   NPRS:  1/10 current; 3/10 at worst past week when walking/being up on feet for a long time  Location:  R anterior and lateral ankle  Description:  Aggravating Factors:  walking and standing 10 mins, 1st thing in AM Easing:  sitting, resting, elevation  PRECAUTIONS: Other: per surgery    WEIGHT BEARING RESTRICTIONS: Yes WBAT  FALLS:  Has patient fallen in last 6 months? 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   PLOF: Independent  PATIENT GOALS: strengthening his foot, reduce stiffness, run a limited distance, return to using the elliptical  NEXT MD VISIT: 04/10/24  OBJECTIVE:  Note: Objective measures were completed at Evaluation unless otherwise noted.  DIAGNOSTIC FINDINGS: Pt is post op.  PATIENT SURVEYS:  LEFS:  40/80  COGNITION: Overall cognitive status: Within functional limits for tasks assessed     OBSERVATION: Portals healed well without any signs of infection.    LOWER EXTREMITY ROM:   ROM Right eval Left Eval AROM 02/26/24 R AROM 02/26/24 L AROM   Hip flexion      Hip extension      Hip  abduction      Hip adduction      Hip internal rotation      Hip external rotation      Knee flexion      Knee extension      Ankle dorsiflexion AROM/PROM:  4/9 16 12* 12*  Ankle plantarflexion AROM 52 64 60* 68*  Ankle inversion AROM/PROM: 11/15 27 22* 27*  Ankle eversion AROM/PROM: 4/2 10 8* 13*   (Blank rows = not tested)    GAIT: Assistive device utilized: None Level of assistance: Complete Independence Comments: Pt ambulating with a heel to toe gait without AD.  Pt slightly favoring R LE at times though not limping.                                                                                                                                   TREATMENT:    03/07/24 Sci fit bike L3 x40min Roller to gastroc/soleus in prone  Standing calf stretch 30sec x3 ea Standing arch doming 2x10 ea  Standing heel raises x 10 Standing heel raises with ER 2x10 Eccentric HR 2x10 Marching (slow) on airex no UE support 2x10 SL hip hinge 2x10ea (shoes doffed) Tandem balance 3x30sec R posterior Step up 8inch with cues for slow eccentric control (pause SLS at top for balance) 2x10 Seated foot adduction 2x10 RTB Squats 2x10  03/03/24 Sci fit bike L4 x33min Standing calf stretch 30sec x3 ea Standing arch doming SLS with foot doming and knee bends 2x10  BAPS board attachment #3, PF/DF x20, inversion/eversion x20, circles x20 CW/CCW Standing heel raises 2 x 10 Standing heel raises with ER 2x10 Standing toe raises 2 x 10 Eccentric HR 2x10 Marching (slow) on airex no UE support 2x10 SL hip hinge 2x5ea Tandem balance 2x20sec each Step up 8inch with cues for slow eccentric control (pause SLS at top for balance) 2x10 Seated foot adduction 2x10 RTB Squats 2x10   PATIENT EDUCATION:  Education details: dx, relevant anatomy, exercise form, HEP, rationale of interventions, and POC.  PT answered pt's questions.  Person educated: Patient Education method: Explanation, Demonstration, Tactile cues, Verbal cues, and Handouts Education comprehension: verbalized understanding, returned demonstration, verbal cues required, tactile cues required, and needs further education  HOME EXERCISE PROGRAM: Access Code: [email or web address] URL: [email or web address] Date: 02/06/2024 Prepared by: Mose Minerva   CLINICAL IMPRESSION:  Performed IASTM using roller to gastroc with tenderness noted throughout. Followed this by gastroc stretching which pt felt relief from. Worked on foot doming in standing with shoes doffed. Pt still better to active intrinsic mm on R foot compared to L, but good effort demonstrated on L. Pt  did feel muscular fatigue with this as well as with SL hinge with shoes doffed. He will benefit from continued stability, NMR and strengthening.     OBJECTIVE IMPAIRMENTS: decreased activity tolerance, decreased endurance, decreased mobility, difficulty walking, decreased ROM, decreased strength, hypomobility, and pain.   ACTIVITY LIMITATIONS:  standing, stairs, and locomotion level  PARTICIPATION LIMITATIONS: running/playing with kids  PERSONAL FACTORS: 1-2 comorbidities: bilat feet pain, L knee chondromalacia are also affecting patient's functional outcome.   REHAB POTENTIAL: Good  CLINICAL DECISION MAKING: Stable/uncomplicated  EVALUATION COMPLEXITY: Low   GOALS:   SHORT TERM GOALS: Target date: 03/05/2024  Pt will be independent and compliant with HEP for improved ROM, pain, strength, and function. Baseline: Goal status: MET 1/5  2.  Pt will demo improved R ankle AROM to at least 15 deg in DF, 25 deg in inversion, and 10-12 deg in eversion for improved ankle stiffness and mobility.  Baseline:  Goal status: IN PROGRESS 1/5  3.  Pt will report at least a 25% improvement in tolerance with standing and ambulation.  Baseline:  Goal status: MET 1/5  4.  Pt will demo symmetrical height with bilat heel raises and tolerate heel raises without increased pain for improved calf strength.  Baseline:  Goal status: INITIAL Target date:  03/19/2024  5.  Pt will be able to perform a 6 inch step up with good control and good form.  Baseline:  Goal status: INITIAL Target date:  03/12/2024   LONG TERM GOALS: Target date: 04/16/2024  Pt will demo 5/5 R ankle strength in DF, Eve, and Inv and tolerate good manual resistance in PF for improved strength and performance of functional mobility.  Baseline:  Goal status: INITIAL  2.  Pt will be ambulating extended community distance without increased ankle pain and difficulty.   Baseline:  Goal status: INITIAL  3.  Pt will be able to ascend  and descend stairs with a reciprocal gait without the rail.  Baseline:  Goal status: INITIAL  4.  Pt will report he is able to perform his normal standing activities and IADL's without significant pain and difficulty.  Baseline:  Goal status: INITIAL  5.  Pt will score 0-1 on the lateral step down test for improved eccentric control and performance of stairs.   Baseline:  Goal status: INITIAL    PLAN:  PT FREQUENCY: 1-2x/week  PT DURATION: other: 8-10 weeks  PLANNED INTERVENTIONS: 97164- PT Re-evaluation, 97750- Physical Performance Testing, 97110-Therapeutic exercises, 97530- Therapeutic activity, 97112- Neuromuscular re-education, 97535- Self Care, 02859- Manual therapy, [phone]- Gait training, [phone]- Aquatic Therapy, [phone]- Electrical stimulation (unattended), 97035- Ultrasound, 79439 (1-2 muscles), [phone] (3+ muscles)- Dry Needling, Patient/Family education, Balance training, Stair training, Taping, Joint mobilization, Scar mobilization, Cryotherapy, and Moist heat  PLAN FOR NEXT SESSION: R ankle/foot strength, functional strengthening     Asberry BRAVO Bretton Tandy, PTA 03/07/2024, 4:57 PM     "

## 2024-03-07 NOTE — Progress Notes (Signed)
 Visit:  ORTHOTIC SCAN/ EVALUATION  Patient presented for evaluation/ scan for custom molded foot orthotics.significant pes planus deformity noted bilateral with pronation upon standing and too many toes sign and loss of arch seen standing and sitting.    Patient will benefit from custom foot orthotics to provide total contact to bilateral medial longitudinal arches to help balance and distribute body weight more evenly.  Thus reducing plantar pressure and pain.   Orthotic will encourage forefoot and rearfoot alignment.    Patient was scanned today with OHI scanner.    Diagnosis:     ICD-10-CM   1. 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 Acquired pronation deformity of foot, right  M21.6X1       Orthotics are ordered.  Signature obtained for notification of pricing/ fees for the device.  When the orthotic is ready for pick up, will call to make an appointment for a fitting.    Sanja Elizardo, DPM

## 2024-03-10 ENCOUNTER — Ambulatory Visit (HOSPITAL_BASED_OUTPATIENT_CLINIC_OR_DEPARTMENT_OTHER): Admitting: Physical Therapy

## 2024-03-10 DIAGNOSIS — M25571 Pain in right ankle and joints of right foot: Secondary | ICD-10-CM | POA: Diagnosis not present

## 2024-03-10 DIAGNOSIS — M25671 Stiffness of right ankle, not elsewhere classified: Secondary | ICD-10-CM

## 2024-03-10 DIAGNOSIS — M6281 Muscle weakness (generalized): Secondary | ICD-10-CM

## 2024-03-10 NOTE — Therapy (Signed)
 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   Allergy     pollen   Past Surgical History:  Procedure Laterality Date   EXCISION / CURETTAGE BONE CYST PHALANGES OF FOOT N/A    both left and right implants in both feet to correct flat feet   VASECTOMY  2019   Patient Active Problem List   Diagnosis Date Noted   Seasonal and perennial allergic rhinitis 07/29/2019   Allergic conjunctivitis 07/29/2019   Recurrent maxillary sinusitis 07/29/2019   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  Pt reports improved ROM and performance of stairs.  He has fatigue with standing < 10 mins and daily ambulation.  Pt reports having soreness after prior treatment though no increased pain.  He states his pain has not increased.  Pt has a therabody waveduo (roller) that he's been using  on his calf.    PERTINENT HISTORY: R ankle surgery 12/07/23 bilat feet pain due to bilat flat feet.  Pt has a silicone implant in L ankle. Recent L foot plantar fasciitis which is doing much better now L knee chondromalacia, L knee pain  PAIN:   NPRS:  2/10 current Location:  R anterior ankle  Description:  Aggravating Factors:  walking and standing 10 mins, 1st thing in AM Easing:  sitting, resting, elevation  PRECAUTIONS: Other: per surgery    WEIGHT BEARING RESTRICTIONS: Yes WBAT  FALLS:  Has patient fallen in last 6 months? 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   PLOF: Independent  PATIENT GOALS: strengthening his foot, reduce stiffness, run a limited distance, return to using the elliptical  NEXT MD VISIT: 04/10/24  OBJECTIVE:  Note: Objective measures were completed at Evaluation unless otherwise noted.  DIAGNOSTIC FINDINGS: Pt is post op.  PATIENT SURVEYS:  LEFS:  40/80  COGNITION: Overall cognitive status: Within functional limits for tasks assessed     OBSERVATION: Portals healed well without any signs of infection.    LOWER EXTREMITY ROM:   ROM Right eval Left Eval AROM 02/26/24 R AROM 02/26/24 L AROM  Hip flexion      Hip extension      Hip abduction      Hip adduction      Hip internal rotation      Hip external rotation      Knee flexion      Knee extension      Ankle dorsiflexion AROM/PROM:  4/9 16 12* 12*  Ankle plantarflexion AROM 52 64 60* 68*  Ankle inversion AROM/PROM: 11/15 27 22* 27*  Ankle eversion AROM/PROM: 4/2 10 8* 13*   (Blank rows = not tested)    GAIT: Assistive device utilized: None Level of assistance: Complete Independence Comments: Pt ambulating with a heel to toe gait without AD.  Pt slightly favoring R LE at times though not limping.                                                                                                                                   TREATMENT:    03/10/24 Sci fit bike L3 x5 min Pt received R ankle DF, PF, Eve, and Inversion per pt and tissue tolerance BAPS board cw, ccw, f/b, s/s Ankle DF with RTB x10, GTB x10 Standing heel raises 2x12-15 Squats x 10 Runner's step up 2x10 with slow eccentric control  SLS 3x20 sec Standing gastroc stretch 2x30 sec  03/07/24 Sci fit bike L3 x41min Roller to gastroc/soleus in prone  Standing calf stretch 30sec x3 ea Standing arch doming 2x10 ea  Standing heel raises x 10 Standing heel raises with ER 2x10 Eccentric HR 2x10 Marching (slow) on airex no UE support 2x10 SL hip hinge 2x10ea (shoes doffed) Tandem balance 3x30sec R posterior Step up 8inch with cues for slow eccentric control (pause SLS at top for balance) 2x10 Seated foot adduction 2x10 RTB Squats 2x10  03/03/24 Sci fit bike L4 x63min Standing calf stretch 30sec x3 ea Standing arch doming SLS with foot doming and knee bends 2x10  BAPS board attachment #3, PF/DF x20, inversion/eversion x20, circles x20 CW/CCW Standing heel raises 2 x 10 Standing heel raises with ER 2x10 Standing toe raises 2 x 10 Eccentric HR 2x10 Marching (slow) on airex no UE support 2x10 SL hip hinge 2x5ea Tandem balance 2x20sec each Step up 8inch with cues for slow eccentric control (pause SLS at top for balance) 2x10 Seated foot adduction 2x10 RTB Squats 2x10   PATIENT EDUCATION:  Education details: dx, relevant anatomy, exercise form, HEP, rationale of interventions, and POC.  PT answered pt's questions.  Person educated: Patient Education method: Explanation, Demonstration, Tactile cues, Verbal cues, and Handouts Education comprehension: verbalized understanding, returned demonstration, verbal cues required, tactile cues required, and needs further education  HOME EXERCISE PROGRAM: Access Code: [email or web address] URL:  [email or web address] Date: 02/06/2024 Prepared by: Mose Minerva   CLINICAL IMPRESSION:  Pt presents to treatment reporting improved ROM and performance of stairs.  He reports having fatigue with standing < 10 mins and daily ambulation.  Pt performed exercises to improve ankle mobility, proprioception, and ankle/LE/functional strength.  He performed exercises well with cuing and instruction in correct form.  PT instructed pt to decrease the depth of his squats.  He states he could feel it in his L knee with squats and PT didn't have pt perform a 2nd set.  He responded well to treatment reporting no increased pain, still 2/10, after treatment.  He should benefit from cont skilled PT to address goals and impairments and to assist with returning to pt's desired level of function.    OBJECTIVE IMPAIRMENTS: decreased activity tolerance, decreased endurance, decreased mobility, difficulty walking, decreased ROM, decreased strength, hypomobility, and pain.   ACTIVITY LIMITATIONS: standing, stairs, and locomotion level  PARTICIPATION LIMITATIONS: running/playing with kids  PERSONAL FACTORS: 1-2 comorbidities: bilat feet pain, L knee chondromalacia are also affecting patient's functional outcome.   REHAB POTENTIAL: Good  CLINICAL DECISION MAKING: Stable/uncomplicated  EVALUATION COMPLEXITY: Low   GOALS:   SHORT TERM GOALS: Target date: 03/05/2024  Pt will be independent and compliant with HEP for improved ROM, pain, strength, and function. Baseline: Goal status: MET 1/5  2.  Pt will demo improved R ankle AROM to at least 15 deg in DF, 25 deg in inversion, and 10-12 deg in eversion for improved ankle stiffness and mobility.  Baseline:  Goal status: IN PROGRESS 1/5  3.  Pt will report at least a 25% improvement in tolerance with standing and ambulation.  Baseline:  Goal status: MET 1/5  4.  Pt will demo symmetrical height with bilat heel raises and tolerate heel raises  without increased pain for improved calf strength.  Baseline:  Goal status: INITIAL Target date:  03/19/2024  5.  Pt will be able to perform a 6 inch step up with good control and good form.  Baseline:  Goal status: INITIAL Target date:  03/12/2024   LONG TERM GOALS: Target date: 04/16/2024  Pt will demo 5/5 R ankle strength in DF, Eve, and Inv and tolerate good manual resistance in PF for improved strength and performance of functional mobility.  Baseline:  Goal status: INITIAL  2.  Pt will be ambulating extended community distance without increased ankle pain and difficulty.   Baseline:  Goal status: INITIAL  3.  Pt will be able to ascend and descend stairs with a reciprocal gait without the rail.  Baseline:  Goal status: INITIAL  4.  Pt will report he is able to perform his normal standing activities and IADL's without significant pain and difficulty.  Baseline:  Goal status: INITIAL  5.  Pt will score 0-1 on the lateral step down test for improved eccentric control and performance of stairs.   Baseline:  Goal status: INITIAL    PLAN:  PT FREQUENCY: 1-2x/week  PT DURATION: other: 8-10 weeks  PLANNED INTERVENTIONS: 97164- PT Re-evaluation, 97750- Physical Performance Testing, 97110-Therapeutic exercises, 97530- Therapeutic activity, 97112- Neuromuscular re-education, 97535- Self Care, 02859- Manual therapy, [phone]- Gait training, [phone]- Aquatic Therapy, [phone]- Electrical stimulation (unattended), 97035- Ultrasound, 79439 (1-2 muscles), 20561 (3+ muscles)- Dry Needling, Patient/Family education, Balance training, Stair training, Taping, Joint mobilization, Scar mobilization, Cryotherapy, and Moist heat  PLAN FOR NEXT SESSION: R ankle/foot strength, functional strengthening     Leigh Minerva III PT, DPT 03/11/2024 10:15 PM      "

## 2024-03-11 ENCOUNTER — Encounter (HOSPITAL_BASED_OUTPATIENT_CLINIC_OR_DEPARTMENT_OTHER): Payer: Self-pay | Admitting: Physical Therapy

## 2024-03-14 ENCOUNTER — Ambulatory Visit (HOSPITAL_BASED_OUTPATIENT_CLINIC_OR_DEPARTMENT_OTHER)

## 2024-03-14 ENCOUNTER — Encounter (HOSPITAL_BASED_OUTPATIENT_CLINIC_OR_DEPARTMENT_OTHER): Payer: Self-pay

## 2024-03-14 DIAGNOSIS — M25571 Pain in right ankle and joints of right foot: Secondary | ICD-10-CM | POA: Diagnosis not present

## 2024-03-14 DIAGNOSIS — M25671 Stiffness of right ankle, not elsewhere classified: Secondary | ICD-10-CM

## 2024-03-14 DIAGNOSIS — M6281 Muscle weakness (generalized): Secondary | ICD-10-CM

## 2024-03-14 NOTE — Therapy (Signed)
 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   Allergy     pollen   Past Surgical History:  Procedure Laterality Date   EXCISION / CURETTAGE BONE CYST PHALANGES OF FOOT N/A    both left and right implants in both feet to correct flat feet   VASECTOMY  2019   Patient Active Problem List   Diagnosis Date Noted   Seasonal and perennial allergic rhinitis 07/29/2019   Allergic conjunctivitis 07/29/2019   Recurrent maxillary sinusitis 07/29/2019   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  Pt reports improved ROM and performance of stairs.  He has fatigue with standing < 10 mins and daily ambulation.  Pt reports having soreness after prior treatment though no increased pain.  He states his pain has not increased.  Pt has a therabody waveduo (roller) that he's been  using on his calf.    PERTINENT HISTORY: R ankle surgery 12/07/23 bilat feet pain due to bilat flat feet.  Pt has a silicone implant in L ankle. Recent L foot plantar fasciitis which is doing much better now L knee chondromalacia, L knee pain  PAIN:   NPRS:  2/10 current Location:  R anterior ankle  Description:  Aggravating Factors:  walking and standing 10 mins, 1st thing in AM Easing:  sitting, resting, elevation  PRECAUTIONS: Other: per surgery    WEIGHT BEARING RESTRICTIONS: Yes WBAT  FALLS:  Has patient fallen in last 6 months? 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   PLOF: Independent  PATIENT GOALS: strengthening his foot, reduce stiffness, run a limited distance, return to using the elliptical  NEXT MD VISIT: 04/10/24  OBJECTIVE:  Note: Objective measures were completed at Evaluation unless otherwise noted.  DIAGNOSTIC FINDINGS: Pt is post op.  PATIENT SURVEYS:  LEFS:  40/80  COGNITION: Overall cognitive status: Within functional limits for tasks assessed     OBSERVATION: Portals healed well without any signs of infection.    LOWER EXTREMITY ROM:   ROM Right eval Left Eval AROM 02/26/24 R AROM 02/26/24 L AROM  Hip flexion      Hip extension      Hip abduction      Hip adduction      Hip internal rotation      Hip external rotation      Knee flexion      Knee extension      Ankle dorsiflexion AROM/PROM:  4/9 16 12* 12*  Ankle plantarflexion AROM 52 64 60* 68*  Ankle inversion AROM/PROM: 11/15 27 22* 27*  Ankle eversion AROM/PROM: 4/2 10 8* 13*   (Blank rows = not tested)    GAIT: Assistive device utilized: None Level of assistance: Complete Independence Comments: Pt ambulating with a heel to toe gait without AD.  Pt slightly favoring R LE at times though not limping.                                                                                                                                   TREATMENT:     03/14/24 Sci fit bike L4 x19min Incline board 30sec x3ea Roller to gastroc/soleus in prone  Standing calf stretch 30sec x3 ea Standing arch doming 2x10 ea  Standing heel raises x 10 Standing heel raises with ER 2x10 Eccentric HR 2x10  SL hip hinge 2x10ea with arch domin Step up 8inch with cues for slow eccentric control (pause SLS at top for balance) 2x10 Squats with arch doming (shoes doffed) 2x10  03/10/24 Sci fit bike L3 x5 min Pt received R ankle DF, PF, Eve, and Inversion per pt and tissue tolerance BAPS board cw, ccw, f/b, s/s Ankle DF with RTB x10, GTB x10 Standing heel raises 2x12-15 Squats x 10 Runner's step up 2x10 with slow eccentric control  SLS 3x20 sec Standing gastroc stretch 2x30 sec  03/07/24 Sci fit bike L3 x109min Roller to gastroc/soleus in prone  Standing calf stretch 30sec x3 ea Standing arch doming 2x10 ea  Standing heel raises x 10 Standing heel raises with ER 2x10 Eccentric HR 2x10 Marching (slow) on airex no UE support 2x10 SL hip hinge 2x10ea (shoes doffed) Tandem balance 3x30sec R posterior Step up 8inch with cues for slow eccentric control (pause SLS at top for balance) 2x10 Seated foot adduction 2x10 RTB Squats 2x10  03/03/24 Sci fit bike L4 x23min Standing calf stretch 30sec x3 ea Standing arch doming SLS with foot doming and knee bends 2x10  BAPS board attachment #3, PF/DF x20, inversion/eversion x20, circles x20 CW/CCW Standing heel raises 2 x 10 Standing heel raises with ER 2x10 Standing toe raises 2 x 10 Eccentric HR 2x10 Marching (slow) on airex no UE support 2x10 SL hip hinge 2x5ea Tandem balance 2x20sec each Step up 8inch with cues for slow eccentric control (pause SLS at top for balance) 2x10 Seated foot adduction 2x10 RTB Squats 2x10   PATIENT EDUCATION:  Education details: dx,  relevant anatomy, exercise form, HEP, rationale of interventions, and POC.  PT answered pt's questions.  Person educated: Patient Education method: Explanation, Demonstration, Tactile cues, Verbal cues, and Handouts Education comprehension: verbalized understanding, returned demonstration, verbal cues required, tactile cues required, and needs further education  HOME EXERCISE PROGRAM: Access Code: [email or web address] URL: [email or web address] Date: 02/06/2024 Prepared by: Mose Minerva   CLINICAL IMPRESSION:  Continued to work on stretching, strengthening and NMC. Pt improving with arch doming, though remains weak compared to contralateral side. Felt good stretch with incline board today. Improving with eccentric HR. Challenged by SL fwd hinge with stabiltiy and control. This did improve as he went on though. Will continue to progress strength, balance, and ROM as tolerated.    OBJECTIVE IMPAIRMENTS: decreased activity tolerance, decreased endurance, decreased mobility, difficulty walking, decreased ROM, decreased strength, hypomobility, and pain.   ACTIVITY LIMITATIONS: standing, stairs, and locomotion level  PARTICIPATION LIMITATIONS: running/playing with kids  PERSONAL FACTORS: 1-2 comorbidities: bilat feet pain, L knee chondromalacia are also affecting patient's functional outcome.   REHAB POTENTIAL: Good  CLINICAL DECISION MAKING: Stable/uncomplicated  EVALUATION COMPLEXITY: Low   GOALS:   SHORT TERM GOALS: Target date: 03/05/2024  Pt will be independent and compliant with HEP for improved ROM, pain, strength, and function. Baseline: Goal status: MET 1/5  2.  Pt will demo improved R ankle AROM to at least 15 deg in DF, 25 deg in inversion, and 10-12 deg in eversion for improved ankle stiffness and mobility.  Baseline:  Goal status: IN PROGRESS 1/5  3.  Pt will report at least a 25% improvement in tolerance with standing and ambulation.  Baseline:  Goal status:  MET 1/5  4.  Pt will demo symmetrical height with bilat heel raises and tolerate heel raises without increased pain for improved calf strength.  Baseline:  Goal status: INITIAL Target date:  03/19/2024  5.  Pt will be able to perform a 6 inch step up with good control and good form.  Baseline:  Goal status: INITIAL Target date:  03/12/2024   LONG TERM GOALS: Target date: 04/16/2024  Pt will demo 5/5 R ankle strength in DF, Eve, and Inv and tolerate good manual resistance in PF for improved strength and performance of functional mobility.  Baseline:  Goal status: INITIAL  2.  Pt will be ambulating extended community distance without increased ankle pain and difficulty.   Baseline:  Goal status: INITIAL  3.  Pt will be able to ascend and descend stairs with a reciprocal gait without the rail.  Baseline:  Goal status: INITIAL  4.  Pt will report he is able to perform his normal standing activities and IADL's without significant pain and difficulty.  Baseline:  Goal status: INITIAL  5.  Pt will score 0-1 on the lateral step down test for improved eccentric control and performance of stairs.   Baseline:  Goal status: INITIAL    PLAN:  PT FREQUENCY: 1-2x/week  PT DURATION: other: 8-10 weeks  PLANNED INTERVENTIONS: 97164- PT Re-evaluation, 97750- Physical Performance Testing, 97110-Therapeutic exercises, 97530- Therapeutic activity, 97112- Neuromuscular re-education, 97535- Self Care, 02859- Manual therapy, [phone]- Gait training, [phone]- Aquatic Therapy, [phone]- Electrical stimulation (unattended), 97035- Ultrasound, 79439 (1-2 muscles), 20561 (3+ muscles)- Dry Needling, Patient/Family education, Balance training, Stair training, Taping, Joint mobilization, Scar mobilization, Cryotherapy, and Moist heat  PLAN FOR NEXT SESSION: R ankle/foot strength, functional strengthening     Asberry Rodes, PTA  03/14/24 3:35 PM      "

## 2024-03-17 ENCOUNTER — Telehealth: Payer: Self-pay | Admitting: Podiatry

## 2024-03-17 NOTE — Telephone Encounter (Signed)
 Orthotics are in the [place] office. Spoke to Aleneva and scheduled him an appointment on 03/20/2024 to [facility].

## 2024-03-18 ENCOUNTER — Encounter (HOSPITAL_BASED_OUTPATIENT_CLINIC_OR_DEPARTMENT_OTHER): Payer: Self-pay | Admitting: Physical Therapy

## 2024-03-18 ENCOUNTER — Ambulatory Visit (HOSPITAL_BASED_OUTPATIENT_CLINIC_OR_DEPARTMENT_OTHER): Admitting: Physical Therapy

## 2024-03-18 DIAGNOSIS — M25571 Pain in right ankle and joints of right foot: Secondary | ICD-10-CM | POA: Diagnosis not present

## 2024-03-18 DIAGNOSIS — M6281 Muscle weakness (generalized): Secondary | ICD-10-CM

## 2024-03-18 DIAGNOSIS — M25671 Stiffness of right ankle, not elsewhere classified: Secondary | ICD-10-CM

## 2024-03-18 NOTE — Therapy (Signed)
 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   Allergy     pollen   Past Surgical History:  Procedure Laterality Date   EXCISION / CURETTAGE BONE CYST PHALANGES OF FOOT N/A    both left and right implants in both feet to correct flat feet   VASECTOMY  2019   Patient Active Problem List   Diagnosis Date Noted   Seasonal and perennial allergic rhinitis 07/29/2019   Allergic conjunctivitis 07/29/2019   Recurrent maxillary sinusitis 07/29/2019   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  Pain has been minimal lately.  Pt doesn't have as much pain in AM.  Pt is having less pain with walking.  Pt reports improved performance of stairs and doesn't have to use the rail.  He reports improved flexibility.  He has fatigue with standing < 10 mins and daily  ambulation.  Pt denies any adverse effects after prior treatment.     PERTINENT HISTORY: R ankle surgery 12/07/23 bilat feet pain due to bilat flat feet.  Pt has a silicone implant in L ankle. Recent L foot plantar fasciitis which is doing much better now L knee chondromalacia, L knee pain  PAIN:   NPRS:  0/10 current, 3/10 worst  Location:  R ankle  Description:  Aggravating Factors:  walking and standing 10 mins, 1st thing in AM Easing:  sitting, resting, elevation  PRECAUTIONS: Other: per surgery    WEIGHT BEARING RESTRICTIONS: Yes WBAT  FALLS:  Has patient fallen in last 6 months? 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   PLOF: Independent  PATIENT GOALS: strengthening his foot, reduce stiffness, run a limited distance, return to using the elliptical  NEXT MD VISIT: 04/10/24  OBJECTIVE:  Note: Objective measures were completed at Evaluation unless otherwise noted.  DIAGNOSTIC FINDINGS: Pt is post op.  PATIENT SURVEYS:  LEFS:  40/80 LEFS:  56/80  (03/18/24)  COGNITION: Overall cognitive status: Within functional limits for tasks assessed     OBSERVATION: Portals healed well without any signs of infection.  LOWER EXTREMITY ROM:   ROM Right eval Left Eval AROM 02/26/24 R AROM 02/26/24 L AROM  03/18/24  Hip flexion       Hip extension       Hip abduction       Hip adduction       Hip internal rotation       Hip external rotation       Knee flexion       Knee extension       Ankle dorsiflexion AROM/PROM:  4/9 16 12* 12* 21  Ankle plantarflexion AROM 52 64 60* 68* 60  Ankle inversion AROM/PROM: 11/15 27 22* 27* 20  Ankle eversion AROM/PROM: 4/2 10 8* 13* 10   (Blank rows = not tested)    GAIT: Assistive device utilized: None Level of assistance: Complete Independence Comments: Pt  ambulating with a heel to toe gait without AD.  Pt slightly favoring R LE at times though not limping.                                                                                                                                  TREATMENT:     03/18/24 Sci fit bike L4 x5 min BAPS board cw, ccw, f/b, s/s PT assessed ankle ROM.  See above.  Bilat heel raises x 10 Heel raises concentric bilat, SL eccentric phase x12 Step up 8 inch with cues for slow eccentric control (pause SLS at top for balance) 2x10 SLS on airex 3x20 sec SL hip hinge 2x10 with arch doming  Pt completed LEFS.  See above     03/14/24 Sci fit bike L4 x74min Incline board 30sec x3ea Roller to gastroc/soleus in prone  Standing calf stretch 30sec x3 ea Standing arch doming 2x10 ea  Standing heel raises x 10 Standing heel raises with ER 2x10 Eccentric HR 2x10  SL hip hinge 2x10ea with arch domin Step up 8inch with cues for slow eccentric control (pause SLS at top for balance) 2x10 Squats with arch doming (shoes doffed) 2x10  03/10/24 Sci fit bike L3 x5 min Pt received R ankle DF, PF, Eve, and Inversion per pt and tissue tolerance BAPS board cw, ccw, f/b, s/s Ankle DF with RTB x10, GTB x10 Standing heel raises 2x12-15 Squats x 10 Runner's step up 2x10 with slow eccentric control  SLS 3x20 sec Standing gastroc stretch 2x30 sec  03/07/24 Sci fit bike L3 x69min Roller to gastroc/soleus in prone  Standing calf stretch 30sec x3 ea Standing arch doming 2x10 ea  Standing heel raises x 10 Standing heel raises with ER 2x10 Eccentric HR 2x10 Marching (slow) on airex no UE support 2x10 SL hip hinge 2x10ea (shoes doffed) Tandem balance 3x30sec R posterior Step up 8inch with cues for slow eccentric control (pause SLS at top for balance) 2x10 Seated foot adduction 2x10 RTB Squats 2x10  03/03/24 Sci fit bike L4 x4min Standing calf stretch 30sec x3 ea Standing  arch doming SLS with foot doming and knee bends  2x10  BAPS board attachment #3, PF/DF x20, inversion/eversion x20, circles x20 CW/CCW Standing heel raises 2 x 10 Standing heel raises with ER 2x10 Standing toe raises 2 x 10 Eccentric HR 2x10 Marching (slow) on airex no UE support 2x10 SL hip hinge 2x5ea Tandem balance 2x20sec each Step up 8inch with cues for slow eccentric control (pause SLS at top for balance) 2x10 Seated foot adduction 2x10 RTB Squats 2x10   PATIENT EDUCATION:  Education details: dx, relevant anatomy, exercise form, HEP, rationale of interventions, and POC.  PT answered pt's questions.  Person educated: Patient Education method: Explanation, Demonstration, Tactile cues, Verbal cues, and Handouts Education comprehension: verbalized understanding, returned demonstration, verbal cues required, tactile cues required, and needs further education  HOME EXERCISE PROGRAM: Access Code: [email or web address] URL: [email or web address] Date: 02/06/2024 Prepared by: Mose Minerva   CLINICAL IMPRESSION:  Pt is 14 weeks and 4 days post op making great progress.  He is improving with functional mobility and having less pain.  He reports improved performance of stairs and doesn't have to use the rail.  He has fatigue with standing and ambulation.  Pt is improving with R ankle AROM.  Pt has symmetrical bilat heel raises.  He is improving with strength and stability as evidenced by progression and performance of exercises.  He has good tolerance with exercises.  Pt demonstrated clinically significant improvement in self perceived disability with LEFS improving from 40/80 initially to 56/80 currently.  Pt has met all STG's except partially meeting #2.  Pt should benefit from cont skilled PT to address ongoing goals and impairments and to assist in restoring desired level of function.    OBJECTIVE IMPAIRMENTS: decreased activity tolerance, decreased endurance, decreased mobility, difficulty walking, decreased ROM, decreased  strength, hypomobility, and pain.   ACTIVITY LIMITATIONS: standing, stairs, and locomotion level  PARTICIPATION LIMITATIONS: running/playing with kids  PERSONAL FACTORS: 1-2 comorbidities: bilat feet pain, L knee chondromalacia are also affecting patient's functional outcome.   REHAB POTENTIAL: Good  CLINICAL DECISION MAKING: Stable/uncomplicated  EVALUATION COMPLEXITY: Low   GOALS:   SHORT TERM GOALS: Target date: 03/05/2024  Pt will be independent and compliant with HEP for improved ROM, pain, strength, and function. Baseline: Goal status: MET 1/5  2.  Pt will demo improved R ankle AROM to at least 15 deg in DF, 25 deg in inversion, and 10-12 deg in eversion for improved ankle stiffness and mobility.  Baseline:  Goal status: 66% MET  3.  Pt will report at least a 25% improvement in tolerance with standing and ambulation.  Baseline:  Goal status: MET 1/5  4.  Pt will demo symmetrical height with bilat heel raises and tolerate heel raises without increased pain for improved calf strength.  Baseline:  Goal status: GOAL MET  1/20 Target date:  03/19/2024  5.  Pt will be able to perform a 6 inch step up with good control and good form.  Baseline:  Goal status:  GOAL  MET 1/20 Target date:  03/12/2024   LONG TERM GOALS: Target date: 04/16/2024  Pt will demo 5/5 R ankle strength in DF, Eve, and Inv and tolerate good manual resistance in PF for improved strength and performance of functional mobility.  Baseline:  Goal status: INITIAL  2.  Pt will be ambulating extended community distance without increased ankle pain and difficulty.   Baseline:  Goal status: INITIAL  3.  Pt will be able to ascend and descend  stairs with a reciprocal gait without the rail.  Baseline:  Goal status: INITIAL  4.  Pt will report he is able to perform his normal standing activities and IADL's without significant pain and difficulty.  Baseline:  Goal status: INITIAL  5.  Pt will score 0-1 on  the lateral step down test for improved eccentric control and performance of stairs.   Baseline:  Goal status: INITIAL    PLAN:  PT FREQUENCY: 1-2x/week  PT DURATION: other: 8-10 weeks  PLANNED INTERVENTIONS: 97164- PT Re-evaluation, 97750- Physical Performance Testing, 97110-Therapeutic exercises, 97530- Therapeutic activity, 97112- Neuromuscular re-education, 97535- Self Care, 02859- Manual therapy, [phone]- Gait training, [phone]- Aquatic Therapy, [phone]- Electrical stimulation (unattended), 97035- Ultrasound, 79439 (1-2 muscles), [phone] (3+ muscles)- Dry Needling, Patient/Family education, Balance training, Stair training, Taping, Joint mobilization, Scar mobilization, Cryotherapy, and Moist heat  PLAN FOR NEXT SESSION: R ankle/foot strength, functional strengthening    Leigh Minerva III PT, DPT 03/20/24 9:16 PM       "

## 2024-03-20 ENCOUNTER — Ambulatory Visit: Admitting: Podiatrist

## 2024-03-20 DIAGNOSIS — M216X1 Other acquired deformities of right foot: Secondary | ICD-10-CM

## 2024-03-20 DIAGNOSIS — M2142 Flat foot [pes planus] (acquired), left foot: Secondary | ICD-10-CM

## 2024-03-20 DIAGNOSIS — M722 Plantar fascial fibromatosis: Secondary | ICD-10-CM

## 2024-03-20 DIAGNOSIS — M25571 Pain in right ankle and joints of right foot: Secondary | ICD-10-CM

## 2024-03-20 DIAGNOSIS — M2141 Flat foot [pes planus] (acquired), right foot: Secondary | ICD-10-CM

## 2024-03-20 DIAGNOSIS — M216X2 Other acquired deformities of left foot: Secondary | ICD-10-CM

## 2024-03-20 NOTE — Progress Notes (Signed)

## 2024-03-21 ENCOUNTER — Ambulatory Visit (HOSPITAL_BASED_OUTPATIENT_CLINIC_OR_DEPARTMENT_OTHER)

## 2024-03-21 ENCOUNTER — Encounter (HOSPITAL_BASED_OUTPATIENT_CLINIC_OR_DEPARTMENT_OTHER): Payer: Self-pay

## 2024-03-21 DIAGNOSIS — M25571 Pain in right ankle and joints of right foot: Secondary | ICD-10-CM | POA: Diagnosis not present

## 2024-03-21 DIAGNOSIS — M25671 Stiffness of right ankle, not elsewhere classified: Secondary | ICD-10-CM

## 2024-03-21 DIAGNOSIS — M6281 Muscle weakness (generalized): Secondary | ICD-10-CM

## 2024-03-21 NOTE — Therapy (Signed)
 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   Allergy     pollen   Past Surgical History:  Procedure Laterality Date   EXCISION / CURETTAGE BONE CYST PHALANGES OF FOOT N/A    both left and right implants in both feet to correct flat feet   VASECTOMY  2019   Patient Active Problem List   Diagnosis Date Noted   Seasonal and perennial allergic rhinitis 07/29/2019   Allergic conjunctivitis 07/29/2019   Recurrent maxillary sinusitis 07/29/2019   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   Hyperlipidemia 08/07/2018     REFERRING PROVIDER: Silva Juliene SAUNDERS, DPM   REFERRING DIAG: [phone] (ICD-10-CM) - Other synovitis and tenosynovitis, right ankle and foot M21.41,M21.42 (ICD-10-CM) - Pes planus of both feet M72.2 (ICD-10-CM) - Plantar fasciitis of right foot  THERAPY DIAG:  Pain in right ankle and joints of right foot  Stiffness of right ankle, not elsewhere classified  Muscle weakness (generalized)  Rationale for Evaluation and Treatment: Rehabilitation  ONSET DATE: DOS 12/07/2023  SUBJECTIVE:   SUBJECTIVE STATEMENT:  Pt reports his outer R ankle bothers him sometimes. There's scar tissue or something there. Feels like it blocks my motion. Pt reports no pain in the mornings anymore.     PERTINENT HISTORY: R ankle surgery 12/07/23 bilat feet pain due to bilat flat feet.  Pt has a silicone implant in L ankle. Recent L foot  plantar fasciitis which is doing much better now L knee chondromalacia, L knee pain  PAIN:   NPRS:  0/10 current, 3/10 worst  Location:  R ankle  Description:  Aggravating Factors:  walking and standing 10 mins, 1st thing in AM Easing:  sitting, resting, elevation  PRECAUTIONS: Other: per surgery    WEIGHT BEARING RESTRICTIONS: Yes WBAT  FALLS:  Has patient fallen in last 6 months? 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   PLOF: Independent  PATIENT GOALS: strengthening his foot, reduce stiffness, run a limited distance, return to using the elliptical  NEXT MD VISIT: 04/10/24  OBJECTIVE:  Note: Objective measures were completed at Evaluation unless otherwise noted.  DIAGNOSTIC FINDINGS: Pt is post op.  PATIENT SURVEYS:  LEFS:  40/80 LEFS:  56/80  (03/18/24)  COGNITION: Overall cognitive status: Within functional limits for tasks assessed     OBSERVATION: Portals healed well without any signs of infection.    LOWER EXTREMITY ROM:   ROM Right eval Left Eval AROM 02/26/24 R AROM 02/26/24 L AROM  03/18/24  Hip flexion       Hip extension       Hip abduction  Hip adduction       Hip internal rotation       Hip external rotation       Knee flexion       Knee extension       Ankle dorsiflexion AROM/PROM:  4/9 16 12* 12* 21  Ankle plantarflexion AROM 52 64 60* 68* 60  Ankle inversion AROM/PROM: 11/15 27 22* 27* 20  Ankle eversion AROM/PROM: 4/2 10 8* 13* 10   (Blank rows = not tested)    GAIT: Assistive device utilized: None Level of assistance: Complete Independence Comments: Pt ambulating with a heel to toe gait without AD.  Pt slightly favoring R LE at times though not limping.                                                                                                                                   TREATMENT:     03/21/24 Sci fit bike L4 x82min Standing calf stretch off 4  step 30sec x3 ea Standing arch doming 2x10 ea  Standing heel raises 2x 10 Standing heel raises with ER 2x10 Eccentric HR 2x10  SL hip hinge 2x10ea with arch doming Step up 8inch with cues for slow eccentric control (pause SLS at top for balance) 2x10 Squats with arch doming (shoes doffed) 2x10 STM to lateral R ankle   03/18/24 Sci fit bike L4 x5 min BAPS board cw, ccw, f/b, s/s PT assessed ankle ROM.  See above.  Bilat heel raises x 10 Heel raises concentric bilat, SL eccentric phase x12 Step up 8 inch with cues for slow eccentric control (pause SLS at top for balance) 2x10 SLS on airex 3x20 sec SL hip hinge 2x10 with arch doming  Pt completed LEFS.  See above     03/14/24 Sci fit bike L4 x76min Incline board 30sec x3ea Roller to gastroc/soleus in prone  Standing calf stretch 30sec x3 ea Standing arch doming 2x10 ea  Standing heel raises x 10 Standing heel raises with ER 2x10 Eccentric HR 2x10  SL hip hinge 2x10ea with arch domin Step up 8inch with cues for slow eccentric control (pause SLS at top for balance) 2x10 Squats with arch doming (shoes doffed) 2x10  03/10/24 Sci fit bike L3 x5 min Pt received R ankle DF, PF, Eve, and Inversion per pt and tissue tolerance BAPS board cw, ccw, f/b, s/s Ankle DF with RTB x10, GTB x10 Standing heel raises 2x12-15 Squats x 10 Runner's step up 2x10 with slow eccentric control  SLS 3x20 sec Standing gastroc stretch 2x30 sec   PATIENT EDUCATION:  Education details: dx, relevant anatomy, exercise form, HEP, rationale of interventions, and POC.  PT answered pt's questions.  Person educated: Patient Education method: Explanation, Demonstration, Tactile cues, Verbal cues, and Handouts Education comprehension: verbalized understanding, returned demonstration, verbal cues required, tactile cues required, and needs further education  HOME  EXERCISE PROGRAM: Access Code: [email or web address] URL: [email or web address] Date: 02/06/2024  Prepared by: Mose Minerva   CLINICAL IMPRESSION:  Pt able to progress with exercises today with good tolerance. He has improved NMC with exercises though is still challenged with stability and control. Pt has improved pain level with activity. No pain reported during session. STM to lateral ankle performed without increase in pain. Will continue to progress as tolerated.    OBJECTIVE IMPAIRMENTS: decreased activity tolerance, decreased endurance, decreased mobility, difficulty walking, decreased ROM, decreased strength, hypomobility, and pain.   ACTIVITY LIMITATIONS: standing, stairs, and locomotion level  PARTICIPATION LIMITATIONS: running/playing with kids  PERSONAL FACTORS: 1-2 comorbidities: bilat feet pain, L knee chondromalacia are also affecting patient's functional outcome.   REHAB POTENTIAL: Good  CLINICAL DECISION MAKING: Stable/uncomplicated  EVALUATION COMPLEXITY: Low   GOALS:   SHORT TERM GOALS: Target date: 03/05/2024  Pt will be independent and compliant with HEP for improved ROM, pain, strength, and function. Baseline: Goal status: MET 1/5  2.  Pt will demo improved R ankle AROM to at least 15 deg in DF, 25 deg in inversion, and 10-12 deg in eversion for improved ankle stiffness and mobility.  Baseline:  Goal status: 66% MET  3.  Pt will report at least a 25% improvement in tolerance with standing and ambulation.  Baseline:  Goal status: MET 1/5  4.  Pt will demo symmetrical height with bilat heel raises and tolerate heel raises without increased pain for improved calf strength.  Baseline:  Goal status: GOAL MET  1/20 Target date:  03/19/2024  5.  Pt will be able to perform a 6 inch step up with good control and good form.  Baseline:  Goal status:  GOAL  MET 1/20 Target date:  03/12/2024   LONG TERM GOALS: Target date: 04/16/2024  Pt will demo 5/5 R  ankle strength in DF, Eve, and Inv and tolerate good manual resistance in PF for improved strength and performance of functional mobility.  Baseline:  Goal status: INITIAL  2.  Pt will be ambulating extended community distance without increased ankle pain and difficulty.   Baseline:  Goal status: INITIAL  3.  Pt will be able to ascend and descend stairs with a reciprocal gait without the rail.  Baseline:  Goal status: INITIAL  4.  Pt will report he is able to perform his normal standing activities and IADL's without significant pain and difficulty.  Baseline:  Goal status: INITIAL  5.  Pt will score 0-1 on the lateral step down test for improved eccentric control and performance of stairs.   Baseline:  Goal status: INITIAL    PLAN:  PT FREQUENCY: 1-2x/week  PT DURATION: other: 8-10 weeks  PLANNED INTERVENTIONS: 97164- PT Re-evaluation, 97750- Physical Performance Testing, 97110-Therapeutic exercises, 97530- Therapeutic activity, 97112- Neuromuscular re-education, 97535- Self Care, 02859- Manual therapy, [phone]- Gait training, [phone]- Aquatic Therapy, [phone]- Electrical stimulation (unattended), 97035- Ultrasound, 79439 (1-2 muscles), 20561 (3+ muscles)- Dry Needling, Patient/Family education, Balance training, Stair training, Taping, Joint mobilization, Scar mobilization, Cryotherapy, and Moist heat  PLAN FOR NEXT SESSION: R ankle/foot strength, functional strengthening    Asberry Rodes, PTA  03/21/24 3:49 PM       "

## 2024-03-26 ENCOUNTER — Encounter (HOSPITAL_BASED_OUTPATIENT_CLINIC_OR_DEPARTMENT_OTHER): Payer: Self-pay | Admitting: Physical Therapy

## 2024-03-26 ENCOUNTER — Ambulatory Visit (HOSPITAL_BASED_OUTPATIENT_CLINIC_OR_DEPARTMENT_OTHER): Admitting: Physical Therapy

## 2024-03-26 DIAGNOSIS — M25671 Stiffness of right ankle, not elsewhere classified: Secondary | ICD-10-CM

## 2024-03-26 DIAGNOSIS — M6281 Muscle weakness (generalized): Secondary | ICD-10-CM

## 2024-03-26 DIAGNOSIS — M25571 Pain in right ankle and joints of right foot: Secondary | ICD-10-CM | POA: Diagnosis not present

## 2024-03-26 NOTE — Therapy (Signed)
 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   Allergy     pollen   Past Surgical History:  Procedure Laterality Date   EXCISION / CURETTAGE BONE CYST PHALANGES OF FOOT N/A    both left and right implants in both feet to correct flat feet   VASECTOMY  2019   Patient Active Problem List   Diagnosis Date Noted   Seasonal and perennial allergic rhinitis 07/29/2019   Allergic conjunctivitis 07/29/2019   Recurrent maxillary sinusitis 07/29/2019   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  He denies pain currently.  He reports no pain or adverse effects after prior treatment.  He received new inserts last week from [facility] and states thery are great.      PERTINENT HISTORY: R ankle surgery 12/07/23 bilat feet pain due to bilat flat feet.  Pt has a  silicone implant in L ankle. Recent L foot plantar fasciitis which is doing much better now L knee chondromalacia, L knee pain  PAIN:   NPRS:  0/10 current, 3/10 worst  Location:  R ankle  Description:  Aggravating Factors:  walking and standing 10 mins, 1st thing in AM Easing:  sitting, resting, elevation  PRECAUTIONS: Other: per surgery    WEIGHT BEARING RESTRICTIONS: Yes WBAT  FALLS:  Has patient fallen in last 6 months? 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   PLOF: Independent  PATIENT GOALS: strengthening his foot, reduce stiffness, run a limited distance, return to using the elliptical  NEXT MD VISIT: 04/10/24  OBJECTIVE:  Note: Objective measures were completed at Evaluation unless otherwise noted.  DIAGNOSTIC FINDINGS: Pt is post op.  PATIENT SURVEYS:  LEFS:  40/80 LEFS:  56/80  (03/18/24)  COGNITION: Overall cognitive status: Within functional limits for tasks assessed     OBSERVATION: Portals healed well without any signs of infection.    LOWER EXTREMITY ROM:   ROM Right eval Left Eval AROM 02/26/24 R AROM 02/26/24 L AROM  03/18/24  Hip flexion       Hip  extension       Hip abduction       Hip adduction       Hip internal rotation       Hip external rotation       Knee flexion       Knee extension       Ankle dorsiflexion AROM/PROM:  4/9 16 12* 12* 21  Ankle plantarflexion AROM 52 64 60* 68* 60  Ankle inversion AROM/PROM: 11/15 27 22* 27* 20  Ankle eversion AROM/PROM: 4/2 10 8* 13* 10   (Blank rows = not tested)    GAIT: Assistive device utilized: None Level of assistance: Complete Independence Comments: Pt ambulating with a heel to toe gait without AD.  Pt slightly favoring R LE at times though not limping.                                                                                                                                   TREATMENT:    03/26/24 Upright bike  L1-4 x 6 mins Standing heel raises with ER 2x10 Eccentric HR 2x10  Step up 8 inch with cues for slow eccentric control (pause SLS at top for balance) 2x10 SLS 2x30 sec Cone taps with cone on stool 3x10 Squats on airex 2x10 Incline calf stretch 3x30 sec BAPS board 2x10 each cw, ccw, f/b, and s/s    03/21/24 Sci fit bike L4 x47min Standing calf stretch off 4  step 30sec x3 ea Standing arch doming 2x10 ea  Standing heel raises 2x 10 Standing heel raises with ER 2x10 Eccentric HR 2x10  SL hip hinge 2x10ea with arch doming Step up 8inch with cues for slow eccentric control (pause SLS at top for balance) 2x10 Squats with arch doming (shoes doffed) 2x10 STM to lateral R ankle   03/18/24 Sci fit bike L4 x5 min BAPS board cw, ccw, f/b, s/s PT assessed ankle ROM.  See above.  Bilat heel raises x 10 Heel raises concentric bilat, SL eccentric phase x12 Step up 8 inch with cues for slow eccentric control (pause SLS at top for balance) 2x10 SLS on airex 3x20 sec SL hip hinge 2x10 with arch doming  Pt completed LEFS.  See above     03/14/24 Sci fit bike L4 x2min Incline board 30sec x3ea Roller to gastroc/soleus in prone  Standing calf stretch 30sec x3 ea Standing arch doming 2x10 ea  Standing heel raises x 10 Standing heel raises with ER 2x10 Eccentric HR 2x10  SL hip hinge 2x10ea with arch domin Step up 8inch with cues for slow eccentric control (pause SLS at top for balance) 2x10 Squats with arch doming (shoes doffed) 2x10  03/10/24 Sci fit bike L3 x5 min Pt received R ankle DF, PF, Eve, and Inversion per pt and tissue tolerance BAPS board cw, ccw, f/b, s/s Ankle DF with RTB x10, GTB x10 Standing heel raises 2x12-15 Squats x 10 Runner's step  up 2x10 with slow eccentric control  SLS 3x20 sec Standing gastroc stretch 2x30 sec   PATIENT EDUCATION:  Education details: dx,  relevant anatomy, exercise form, HEP, rationale of interventions, and POC.  PT answered pt's questions.  Person educated: Patient Education method: Explanation, Demonstration, Tactile cues, Verbal cues, and Handouts Education comprehension: verbalized understanding, returned demonstration, verbal cues required, tactile cues required, and needs further education  HOME EXERCISE PROGRAM: Access Code: [email or web address] URL: [email or web address] Date: 02/06/2024 Prepared by: Mose Minerva   CLINICAL IMPRESSION:  Pt continues to make great progress.  He is progressing with strength, stability, and proprioception as evidenced by performance and progression of exercises.  Pt had good control with SLS on airex and squats on airex.  He performed exercises well without c/o's.  Pt tolerated treatment well reporting no pain and having no c/o's after treatment.  Pt should benefit from cont skilled PT to address impairments and goals and improve overall function.  OBJECTIVE IMPAIRMENTS: decreased activity tolerance, decreased endurance, decreased mobility, difficulty walking, decreased ROM, decreased strength, hypomobility, and pain.   ACTIVITY LIMITATIONS: standing, stairs, and locomotion level  PARTICIPATION LIMITATIONS: running/playing with kids  PERSONAL FACTORS: 1-2 comorbidities: bilat feet pain, L knee chondromalacia are also affecting patient's functional outcome.   REHAB POTENTIAL: Good  CLINICAL DECISION MAKING: Stable/uncomplicated  EVALUATION COMPLEXITY: Low   GOALS:   SHORT TERM GOALS: Target date: 03/05/2024  Pt will be independent and compliant with HEP for improved ROM, pain, strength, and function. Baseline: Goal status: MET 1/5  2.  Pt will demo improved R ankle AROM to at least 15 deg in DF, 25 deg in inversion, and 10-12 deg in eversion for improved ankle stiffness and mobility.  Baseline:  Goal status: 66% MET  3.  Pt will report at least a 25% improvement in  tolerance with standing and ambulation.  Baseline:  Goal status: MET 1/5  4.  Pt will demo symmetrical height with bilat heel raises and tolerate heel raises without increased pain for improved calf strength.  Baseline:  Goal status: GOAL MET  1/20 Target date:  03/19/2024  5.  Pt will be able to perform a 6 inch step up with good control and good form.  Baseline:  Goal status:  GOAL  MET 1/20 Target date:  03/12/2024   LONG TERM GOALS: Target date: 04/16/2024  Pt will demo 5/5 R ankle strength in DF, Eve, and Inv and tolerate good manual resistance in PF for improved strength and performance of functional mobility.  Baseline:  Goal status: INITIAL  2.  Pt will be ambulating extended community distance without increased ankle pain and difficulty.   Baseline:  Goal status: INITIAL  3.  Pt will be able to ascend and descend stairs with a reciprocal gait without the rail.  Baseline:  Goal status: INITIAL  4.  Pt will report he is able to perform his normal standing activities and IADL's without significant pain and difficulty.  Baseline:  Goal status: INITIAL  5.  Pt will score 0-1 on the lateral step down test for improved eccentric control and performance of stairs.   Baseline:  Goal status: INITIAL    PLAN:  PT FREQUENCY: 1-2x/week  PT DURATION: other: 8-10 weeks  PLANNED INTERVENTIONS: 97164- PT Re-evaluation, 97750- Physical Performance Testing, 97110-Therapeutic exercises, 97530- Therapeutic activity, [id number]- Neuromuscular re-education, 97535- Self Care, [id number]- Manual therapy, [id number]- Gait training, [phone]- Aquatic Therapy, [phone]- Electrical stimulation (unattended), 97035- Ultrasound, 79439 (1-2 muscles), [phone] (3+ muscles)- Dry Needling, Patient/Family education,  Balance training, Stair training, Taping, Joint mobilization, Scar mobilization, Cryotherapy, and Moist heat  PLAN FOR NEXT SESSION: R ankle/foot strength, proprioception training, and functional strengthening     Leigh Minerva III PT, DPT 03/27/24 10:50 AM        "

## 2024-03-28 ENCOUNTER — Encounter (HOSPITAL_BASED_OUTPATIENT_CLINIC_OR_DEPARTMENT_OTHER): Payer: Self-pay

## 2024-03-28 ENCOUNTER — Ambulatory Visit (HOSPITAL_BASED_OUTPATIENT_CLINIC_OR_DEPARTMENT_OTHER)

## 2024-03-28 DIAGNOSIS — M6281 Muscle weakness (generalized): Secondary | ICD-10-CM

## 2024-03-28 DIAGNOSIS — M25671 Stiffness of right ankle, not elsewhere classified: Secondary | ICD-10-CM

## 2024-03-28 DIAGNOSIS — M25571 Pain in right ankle and joints of right foot: Secondary | ICD-10-CM | POA: Diagnosis not present

## 2024-03-28 NOTE — Therapy (Signed)
 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   Allergy     pollen   Past Surgical History:  Procedure Laterality Date   EXCISION / CURETTAGE BONE CYST PHALANGES OF FOOT N/A    both left and right implants in both feet to correct flat feet   VASECTOMY  2019   Patient Active Problem List   Diagnosis Date Noted   Seasonal and perennial allergic rhinitis 07/29/2019   Allergic conjunctivitis 07/29/2019   Recurrent maxillary sinusitis 07/29/2019   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     PERTINENT HISTORY: R ankle surgery 12/07/23 bilat feet pain due to bilat flat feet.  Pt has a silicone implant in L ankle. Recent L foot plantar fasciitis which is doing much better now L knee chondromalacia, L knee pain  PAIN:   NPRS:  0/10 current, 3/10 worst   Location:  R ankle  Description:  Aggravating Factors:  walking and standing 10 mins, 1st thing in AM Easing:  sitting, resting, elevation  PRECAUTIONS: Other: per surgery    WEIGHT BEARING RESTRICTIONS: Yes WBAT  FALLS:  Has patient fallen in last 6 months? 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   PLOF: Independent  PATIENT GOALS: strengthening his foot, reduce stiffness, run a limited distance, return to using the elliptical  NEXT MD VISIT: 04/10/24  OBJECTIVE:  Note: Objective measures were completed at Evaluation unless otherwise noted.  DIAGNOSTIC FINDINGS: Pt is post op.  PATIENT SURVEYS:  LEFS:  40/80 LEFS:  56/80  (03/18/24)  COGNITION: Overall cognitive status: Within functional limits for tasks assessed     OBSERVATION: Portals healed well without any signs of infection.    LOWER EXTREMITY ROM:   ROM Right eval Left Eval AROM 02/26/24 R AROM 02/26/24 L AROM  03/18/24  Hip flexion       Hip extension       Hip abduction       Hip adduction       Hip internal rotation       Hip external rotation  Knee flexion       Knee extension       Ankle dorsiflexion AROM/PROM:  4/9 16 12* 12* 21  Ankle plantarflexion AROM 52 64 60* 68* 60  Ankle inversion AROM/PROM: 11/15 27 22* 27* 20  Ankle eversion AROM/PROM: 4/2 10 8* 13* 10   (Blank rows = not tested)    GAIT: Assistive device utilized: None Level of assistance: Complete Independence Comments: Pt ambulating with a heel to toe gait without AD.  Pt slightly favoring R LE at times though not limping.                                                                                                                                  TREATMENT:    03/28/24 Sci fit bike  L4 x 6 mins Incline calf stretch 3x30 sec Step up 8 inch with cues for  slow eccentric control (pause SLS at top for balance) 2x10 SLS 2x30 sec Cone taps with cone on stool 3x10 (SLDL) Squats  2x10 SLS on airex with cross body reaches x10ea SLS on airex with KB pass around 10lb 2x5ea     03/26/24 Upright bike  L1-4 x 6 mins Standing heel raises with ER 2x10 Eccentric HR 2x10  Step up 8 inch with cues for slow eccentric control (pause SLS at top for balance) 2x10 SLS 2x30 sec Cone taps with cone on stool 3x10 Squats on airex 2x10 Incline calf stretch 3x30 sec BAPS board 2x10 each cw, ccw, f/b, and s/s    03/21/24 Sci fit bike L4 x31min Standing calf stretch off 4  step 30sec x3 ea Standing arch doming 2x10 ea  Standing heel raises 2x 10 Standing heel raises with ER 2x10 Eccentric HR 2x10  SL hip hinge 2x10ea with arch doming Step up 8inch with cues for slow eccentric control (pause SLS at top for balance) 2x10 Squats with arch doming (shoes doffed) 2x10 STM to lateral R ankle   03/18/24 Sci fit bike L4 x5 min BAPS board cw, ccw, f/b, s/s PT assessed ankle ROM.  See above.  Bilat heel raises x 10 Heel raises concentric bilat, SL eccentric phase x12 Step up 8 inch with cues for slow eccentric control (pause SLS at top for balance) 2x10 SLS on airex 3x20 sec SL hip hinge 2x10 with arch doming  Pt completed LEFS.  See above     03/14/24 Sci fit bike L4 x34min Incline board 30sec x3ea Roller to gastroc/soleus in prone  Standing calf stretch 30sec x3 ea Standing arch doming 2x10 ea  Standing heel raises x 10 Standing heel raises with ER 2x10 Eccentric HR 2x10  SL hip hinge 2x10ea with arch domin Step up 8inch with cues for slow eccentric control (pause SLS at top for balance) 2x10 Squats with arch doming (shoes doffed) 2x10  03/10/24 Sci fit bike L3 x5 min Pt received R ankle DF, PF, Eve, and Inversion per pt  and tissue tolerance BAPS board cw, ccw, f/b, s/s Ankle DF with RTB x10, GTB x10 Standing heel raises 2x12-15 Squats x  10 Runner's step up 2x10 with slow eccentric control  SLS 3x20 sec Standing gastroc stretch 2x30 sec   PATIENT EDUCATION:  Education details: dx, relevant anatomy, exercise form, HEP, rationale of interventions, and POC.  PT answered pt's questions.  Person educated: Patient Education method: Explanation, Demonstration, Tactile cues, Verbal cues, and Handouts Education comprehension: verbalized understanding, returned demonstration, verbal cues required, tactile cues required, and needs further education  HOME EXERCISE PROGRAM: Access Code: [email or web address] URL: [email or web address] Date: 02/06/2024 Prepared by: Mose Minerva   CLINICAL IMPRESSION:  Pt tolerates progressions well without c/o increased pain. Standing tasks were completed with shoes doffed to challenge stability and intrinsic foot mm control. He is challenged by stability focused tasks. Cuing required for proper technique with exercises.   OBJECTIVE IMPAIRMENTS: decreased activity tolerance, decreased endurance, decreased mobility, difficulty walking, decreased ROM, decreased strength, hypomobility, and pain.   ACTIVITY LIMITATIONS: standing, stairs, and locomotion level  PARTICIPATION LIMITATIONS: running/playing with kids  PERSONAL FACTORS: 1-2 comorbidities: bilat feet pain, L knee chondromalacia are also affecting patient's functional outcome.   REHAB POTENTIAL: Good  CLINICAL DECISION MAKING: Stable/uncomplicated  EVALUATION COMPLEXITY: Low   GOALS:   SHORT TERM GOALS: Target date: 03/05/2024  Pt will be independent and compliant with HEP for improved ROM, pain, strength, and function. Baseline: Goal status: MET 1/5  2.  Pt will demo improved R ankle AROM to at least 15 deg in DF, 25 deg in inversion, and 10-12 deg in eversion for improved ankle stiffness and mobility.  Baseline:  Goal status: 66% MET  3.  Pt will report at least a 25% improvement in tolerance with standing and ambulation.   Baseline:  Goal status: MET 1/5  4.  Pt will demo symmetrical height with bilat heel raises and tolerate heel raises without increased pain for improved calf strength.  Baseline:  Goal status: GOAL MET  1/20 Target date:  03/19/2024  5.  Pt will be able to perform a 6 inch step up with good control and good form.  Baseline:  Goal status:  GOAL  MET 1/20 Target date:  03/12/2024   LONG TERM GOALS: Target date: 04/16/2024  Pt will demo 5/5 R ankle strength in DF, Eve, and Inv and tolerate good manual resistance in PF for improved strength and performance of functional mobility.  Baseline:  Goal status: INITIAL  2.  Pt will be ambulating extended community distance without increased ankle pain and difficulty.   Baseline:  Goal status: INITIAL  3.  Pt will be able to ascend and descend stairs with a reciprocal gait without the rail.  Baseline:  Goal status: INITIAL  4.  Pt will report he is able to perform his normal standing activities and IADL's without significant pain and difficulty.  Baseline:  Goal status: INITIAL  5.  Pt will score 0-1 on the lateral step down test for improved eccentric control and performance of stairs.   Baseline:  Goal status: INITIAL    PLAN:  PT FREQUENCY: 1-2x/week  PT DURATION: other: 8-10 weeks  PLANNED INTERVENTIONS: 97164- PT Re-evaluation, 97750- Physical Performance Testing, 97110-Therapeutic exercises, 97530- Therapeutic activity, 97112- Neuromuscular re-education, 97535- Self Care, 02859- Manual therapy, [phone]- Gait training, [phone]- Aquatic Therapy, [phone]- Electrical stimulation (unattended), 97035- Ultrasound, 79439 (1-2 muscles), 20561 (3+ muscles)- Dry Needling, Patient/Family education, Balance training, Stair training, Taping, Joint mobilization, Scar mobilization, Cryotherapy, and  Moist heat  PLAN FOR NEXT SESSION: R ankle/foot strength, proprioception training, and functional strengthening    Asberry Rodes, PTA  03/28/24 5:07  PM        "

## 2024-04-02 ENCOUNTER — Encounter (HOSPITAL_BASED_OUTPATIENT_CLINIC_OR_DEPARTMENT_OTHER): Payer: Self-pay | Admitting: Physical Therapy

## 2024-04-02 ENCOUNTER — Ambulatory Visit (HOSPITAL_BASED_OUTPATIENT_CLINIC_OR_DEPARTMENT_OTHER): Admitting: Physical Therapy

## 2024-04-02 DIAGNOSIS — M25571 Pain in right ankle and joints of right foot: Secondary | ICD-10-CM

## 2024-04-02 DIAGNOSIS — M6281 Muscle weakness (generalized): Secondary | ICD-10-CM

## 2024-04-02 DIAGNOSIS — M25671 Stiffness of right ankle, not elsewhere classified: Secondary | ICD-10-CM

## 2024-04-02 NOTE — Therapy (Signed)
 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   Allergy     pollen   Past Surgical History:  Procedure Laterality Date   EXCISION / CURETTAGE BONE CYST PHALANGES OF FOOT N/A    both left and right implants in both feet to correct flat feet   VASECTOMY  2019   Patient Active Problem List   Diagnosis Date Noted   Seasonal and perennial allergic rhinitis 07/29/2019   Allergic conjunctivitis 07/29/2019   Recurrent maxillary sinusitis 07/29/2019   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  Pt denies pain currently.  He is consistently having no pain.  Pt reports less fatigue and overall more mobility.  Pt denies any adverse effects after prior treatment.     PERTINENT HISTORY: R ankle surgery 12/07/23 bilat feet pain due to bilat flat feet.  Pt has a silicone  implant in L ankle. Recent L foot plantar fasciitis which is doing much better now L knee chondromalacia, L knee pain  PAIN:   NPRS:  0/10 current, 3/10 worst  Location:  R ankle  Description:  Aggravating Factors:  walking and standing 10 mins, 1st thing in AM Easing:  sitting, resting, elevation  PRECAUTIONS: Other: per surgery    WEIGHT BEARING RESTRICTIONS: Yes WBAT  FALLS:  Has patient fallen in last 6 months? 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   PLOF: Independent  PATIENT GOALS: strengthening his foot, reduce stiffness, run a limited distance, return to using the elliptical  NEXT MD VISIT: 04/10/24  OBJECTIVE:  Note: Objective measures were completed at Evaluation unless otherwise noted.  DIAGNOSTIC FINDINGS: Pt is post op.  PATIENT SURVEYS:  LEFS:  40/80 LEFS:  56/80  (03/18/24)  COGNITION: Overall cognitive status: Within functional limits for tasks assessed     OBSERVATION: Portals healed well without any signs of infection.    LOWER EXTREMITY ROM:   ROM Right eval Left Eval AROM 02/26/24 R AROM 02/26/24 L AROM  03/18/24  Hip flexion       Hip extension  Hip abduction       Hip adduction       Hip internal rotation       Hip external rotation       Knee flexion       Knee extension       Ankle dorsiflexion AROM/PROM:  4/9 16 12* 12* 21  Ankle plantarflexion AROM 52 64 60* 68* 60  Ankle inversion AROM/PROM: 11/15 27 22* 27* 20  Ankle eversion AROM/PROM: 4/2 10 8* 13* 10   (Blank rows = not tested)    GAIT: Assistive device utilized: None Level of assistance: Complete Independence Comments: Pt ambulating with a heel to toe gait without AD.  Pt slightly favoring R LE at times though not limping.                                                                                                                                   TREATMENT:    04/02/24 Upright bike  L2-5 x 6 mins Squats on airex 2x10 Heel raises with ER x 10 Heel raises concentric bilat, SL eccentric phase 2x10 Cone taps with cone on stool x 10, on 8 inch step 2x10 Heel taps on 4 inch, 6 inch, and 8 inch step x 10 each SLS plyoback ball toss 3x12 with 2.2# ball BAPS board 2x10 each cw, ccw, f/b, and s/s    03/28/24 Sci fit bike  L4 x 6 mins Incline calf stretch 3x30 sec Step up 8 inch with cues for slow eccentric control (pause SLS at top for balance) 2x10 SLS 2x30 sec Cone taps with cone on stool 3x10 (SLDL) Squats  2x10 SLS on airex with cross body reaches x10ea SLS on airex with KB pass around 10lb 2x5ea    03/26/24 Upright bike  L1-4 x 6 mins Standing heel raises with ER 2x10 Eccentric HR 2x10  Step up 8 inch with cues for slow eccentric control (pause SLS at top for balance) 2x10 SLS 2x30 sec Cone taps with cone on stool 3x10 Squats on airex 2x10 Incline calf stretch 3x30 sec BAPS board 2x10 each cw, ccw, f/b, and s/s    03/21/24 Sci fit bike L4 x25min Standing calf stretch off 4  step 30sec x3 ea Standing arch doming 2x10 ea  Standing heel raises 2x 10 Standing heel raises with ER 2x10 Eccentric HR 2x10  SL hip hinge 2x10ea with arch doming Step up 8inch with cues for slow eccentric control (pause SLS at top for balance) 2x10 Squats with arch doming (shoes doffed) 2x10 STM to lateral R ankle   03/18/24 Sci fit bike L4 x5 min BAPS board cw, ccw, f/b, s/s PT assessed ankle ROM.  See above.  Bilat heel raises x 10 Heel raises concentric bilat, SL eccentric phase x12 Step up 8 inch with cues for slow eccentric control (pause SLS at top for balance) 2x10 SLS on airex 3x20 sec SL hip hinge 2x10  with arch doming  Pt completed LEFS.  See above     03/14/24 Sci fit bike L4 x55min Incline board 30sec x3ea Roller to gastroc/soleus in prone  Standing calf stretch 30sec  x3 ea Standing arch doming 2x10 ea  Standing heel raises x 10 Standing heel raises with ER 2x10 Eccentric HR 2x10  SL hip hinge 2x10ea with arch domin Step up 8inch with cues for slow eccentric control (pause SLS at top for balance) 2x10 Squats with arch doming (shoes doffed) 2x10  03/10/24 Sci fit bike L3 x5 min Pt received R ankle DF, PF, Eve, and Inversion per pt and tissue tolerance BAPS board cw, ccw, f/b, s/s Ankle DF with RTB x10, GTB x10 Standing heel raises 2x12-15 Squats x 10 Runner's step up 2x10 with slow eccentric control  SLS 3x20 sec Standing gastroc stretch 2x30 sec   PATIENT EDUCATION:  Education details: dx, relevant anatomy, exercise form, HEP, rationale of interventions, and POC.  PT answered pt's questions.  Person educated: Patient Education method: Explanation, Demonstration, Tactile cues, Verbal cues, and Handouts Education comprehension: verbalized understanding, returned demonstration, verbal cues required, tactile cues required, and needs further education  HOME EXERCISE PROGRAM: Access Code: [email or web address] URL: [email or web address] Date: 02/06/2024 Prepared by: Mose Minerva   CLINICAL IMPRESSION:  Pt continues to make great progress in PT.  He states he is consistently not having pain.  Pt is improving with ankle strength and proprioception as evidenced by performance of exercises.  He demonstrates improved control and stability with cone taps being able to perform with cone on a lower surface.  Pt performed heel taps on an 8 inch step well.  Pt tolerated treatment well having no pain and no c/o's after treatment.  PT educated pt concerning POC.   OBJECTIVE IMPAIRMENTS: decreased activity tolerance, decreased endurance, decreased mobility, difficulty walking, decreased ROM, decreased strength, hypomobility, and pain.   ACTIVITY LIMITATIONS: standing, stairs, and locomotion level  PARTICIPATION LIMITATIONS: running/playing with  kids  PERSONAL FACTORS: 1-2 comorbidities: bilat feet pain, L knee chondromalacia are also affecting patient's functional outcome.   REHAB POTENTIAL: Good  CLINICAL DECISION MAKING: Stable/uncomplicated  EVALUATION COMPLEXITY: Low   GOALS:   SHORT TERM GOALS: Target date: 03/05/2024  Pt will be independent and compliant with HEP for improved ROM, pain, strength, and function. Baseline: Goal status: MET 1/5  2.  Pt will demo improved R ankle AROM to at least 15 deg in DF, 25 deg in inversion, and 10-12 deg in eversion for improved ankle stiffness and mobility.  Baseline:  Goal status: 66% MET  3.  Pt will report at least a 25% improvement in tolerance with standing and ambulation.  Baseline:  Goal status: MET 1/5  4.  Pt will demo symmetrical height with bilat heel raises and tolerate heel raises without increased pain for improved calf strength.  Baseline:  Goal status: GOAL MET  1/20 Target date:  03/19/2024  5.  Pt will be able to perform a 6 inch step up with good control and good form.  Baseline:  Goal status:  GOAL  MET 1/20 Target date:  03/12/2024   LONG TERM GOALS: Target date: 04/16/2024  Pt will demo 5/5 R ankle strength in DF, Eve, and Inv and tolerate good manual resistance in PF for improved strength and performance of functional mobility.  Baseline:  Goal status: INITIAL  2.  Pt will be ambulating extended community distance without increased ankle pain and difficulty.   Baseline:  Goal status: INITIAL  3.  Pt will be able to ascend and descend stairs with a reciprocal gait without the rail.  Baseline:  Goal status: INITIAL  4.  Pt will report he is able to perform his normal standing activities and IADL's without significant pain and difficulty.  Baseline:  Goal status: INITIAL  5.  Pt will score 0-1 on the lateral step down test for improved eccentric control and performance of stairs.   Baseline:  Goal status: INITIAL    PLAN:  PT  FREQUENCY: 1-2x/week  PT DURATION: other: 8-10 weeks  PLANNED INTERVENTIONS: 97164- PT Re-evaluation, 97750- Physical Performance Testing, 97110-Therapeutic exercises, 97530- Therapeutic activity, 97112- Neuromuscular re-education, 97535- Self Care, 02859- Manual therapy, [phone]- Gait training, [phone]- Aquatic Therapy, [phone]- Electrical stimulation (unattended), 97035- Ultrasound, 79439 (1-2 muscles), 20561 (3+ muscles)- Dry Needling, Patient/Family education, Balance training, Stair training, Taping, Joint mobilization, Scar mobilization, Cryotherapy, and Moist heat  PLAN FOR NEXT SESSION: R ankle/foot strength, proprioception training, and functional strengthening    Leigh Minerva III PT, DPT 04/03/24 11:09 PM         "

## 2024-04-04 ENCOUNTER — Ambulatory Visit (HOSPITAL_BASED_OUTPATIENT_CLINIC_OR_DEPARTMENT_OTHER)

## 2024-04-04 ENCOUNTER — Encounter (HOSPITAL_BASED_OUTPATIENT_CLINIC_OR_DEPARTMENT_OTHER): Payer: Self-pay

## 2024-04-04 DIAGNOSIS — M25571 Pain in right ankle and joints of right foot: Secondary | ICD-10-CM

## 2024-04-04 DIAGNOSIS — M6281 Muscle weakness (generalized): Secondary | ICD-10-CM

## 2024-04-04 DIAGNOSIS — M25671 Stiffness of right ankle, not elsewhere classified: Secondary | ICD-10-CM

## 2024-04-04 NOTE — Therapy (Signed)
 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   Allergy     pollen   Past Surgical History:  Procedure Laterality Date   EXCISION / CURETTAGE BONE CYST PHALANGES OF FOOT N/A    both left and right implants in both feet to correct flat feet   VASECTOMY  2019   Patient Active Problem List   Diagnosis Date Noted   Seasonal and perennial allergic rhinitis 07/29/2019   Allergic conjunctivitis 07/29/2019   Recurrent maxillary sinusitis 07/29/2019   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 Denies any difficulty with ADLs.     PERTINENT HISTORY: R ankle surgery 12/07/23 bilat feet pain due to bilat flat feet.  Pt has a silicone implant in L ankle. Recent L foot plantar fasciitis which is doing much better now L knee chondromalacia, L knee pain  PAIN:   NPRS:   0/10 current, 3/10 worst  Location:  R ankle  Description:  Aggravating Factors:  walking and standing 10 mins, 1st thing in AM Easing:  sitting, resting, elevation  PRECAUTIONS: Other: per surgery    WEIGHT BEARING RESTRICTIONS: Yes WBAT  FALLS:  Has patient fallen in last 6 months? 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   PLOF: Independent  PATIENT GOALS: strengthening his foot, reduce stiffness, run a limited distance, return to using the elliptical  NEXT MD VISIT: 04/10/24  OBJECTIVE:  Note: Objective measures were completed at Evaluation unless otherwise noted.  DIAGNOSTIC FINDINGS: Pt is post op.  PATIENT SURVEYS:  LEFS:  40/80 LEFS:  56/80  (03/18/24)  COGNITION: Overall cognitive status: Within functional limits for tasks assessed     OBSERVATION: Portals healed well without any signs of infection.    LOWER EXTREMITY ROM:   ROM Right eval Left Eval AROM 02/26/24 R AROM 02/26/24 L AROM  03/18/24  Hip flexion       Hip extension       Hip abduction       Hip adduction       Hip internal rotation  Hip external rotation       Knee flexion       Knee extension       Ankle dorsiflexion AROM/PROM:  4/9 16 12* 12* 21  Ankle plantarflexion AROM 52 64 60* 68* 60  Ankle inversion AROM/PROM: 11/15 27 22* 27* 20  Ankle eversion AROM/PROM: 4/2 10 8* 13* 10   (Blank rows = not tested)   LOWER EXTREMITY MMT:    MMT Right 04/04/24 Left 04/04/24   Hip flexion    Hip extension    Hip abduction    Hip adduction    Hip internal rotation    Hip external rotation    Knee flexion    Knee extension    Ankle dorsiflexion 5/5 5/5  Ankle plantarflexion 5/5 5/5  Ankle inversion 5/5 5/5  Ankle eversion 5/5 5/5   (Blank rows = not tested)     GAIT: Assistive device utilized: None Level of  assistance: Complete Independence Comments: Pt ambulating with a heel to toe gait without AD.  Pt slightly favoring R LE at times though not limping.                                                                                                                                  TREATMENT:     04/04/24 Elliptical x8min Standing calf stretching bil 30sec x3ea Lateral step down testing Goal review MMT SLS with short foot bil x15sec each SLS on airex with 10lb KB pass around SLDL with short foot 10lb KB 2x10ea Squats with short foot 2x10   04/02/24 Upright bike  L2-5 x 6 mins Squats on airex 2x10 Heel raises with ER x 10 Heel raises concentric bilat, SL eccentric phase 2x10 Cone taps with cone on stool x 10, on 8 inch step 2x10 Heel taps on 4 inch, 6 inch, and 8 inch step x 10 each SLS plyoback ball toss 3x12 with 2.2# ball BAPS board 2x10 each cw, ccw, f/b, and s/s        PATIENT EDUCATION:  Education details: dx, relevant anatomy, exercise form, HEP, rationale of interventions, and POC.  PT answered pt's questions.  Person educated: Patient Education method: Explanation, Demonstration, Tactile cues, Verbal cues, and Handouts Education comprehension: verbalized understanding, returned demonstration, verbal cues required, tactile cues required, and needs further education  HOME EXERCISE PROGRAM: Access Code: [email or web address] URL: [email or web address] Date: 02/06/2024 Prepared by: Mose Minerva   CLINICAL IMPRESSION:  Pt continues to demonstrate excellent progress. He has met all goals at this time and denies any difficulty with any activity at this time. Good stability on stable surfaces, but still challenged by compliant surfaces. Issued updated HEP to continue with progressions at home. Meets with surgeon next week for f/u. Pt ready for d/c at this time. Will schedule one more visit for formal d/c with PT.   OBJECTIVE IMPAIRMENTS: decreased activity tolerance,  decreased endurance, decreased mobility, difficulty walking, decreased ROM, decreased  strength, hypomobility, and pain.   ACTIVITY LIMITATIONS: standing, stairs, and locomotion level  PARTICIPATION LIMITATIONS: running/playing with kids  PERSONAL FACTORS: 1-2 comorbidities: bilat feet pain, L knee chondromalacia are also affecting patient's functional outcome.   REHAB POTENTIAL: Good  CLINICAL DECISION MAKING: Stable/uncomplicated  EVALUATION COMPLEXITY: Low   GOALS:   SHORT TERM GOALS: Target date: 03/05/2024  Pt will be independent and compliant with HEP for improved ROM, pain, strength, and function. Baseline: Goal status: MET 1/5  2.  Pt will demo improved R ankle AROM to at least 15 deg in DF, 25 deg in inversion, and 10-12 deg in eversion for improved ankle stiffness and mobility.  Baseline:  Goal status: 66% MET  3.  Pt will report at least a 25% improvement in tolerance with standing and ambulation.  Baseline:  Goal status: MET 1/5  4.  Pt will demo symmetrical height with bilat heel raises and tolerate heel raises without increased pain for improved calf strength.  Baseline:  Goal status: GOAL MET  1/20 Target date:  03/19/2024  5.  Pt will be able to perform a 6 inch step up with good control and good form.  Baseline:  Goal status:  GOAL  MET 1/20 Target date:  03/12/2024   LONG TERM GOALS: Target date: 04/16/2024  Pt will demo 5/5 R ankle strength in DF, Eve, and Inv and tolerate good manual resistance in PF for improved strength and performance of functional mobility.  Baseline:  Goal status: MET 04/04/24  2.  Pt will be ambulating extended community distance without increased ankle pain and difficulty.   Baseline:  Goal status: MET 04/04/24  3.  Pt will be able to ascend and descend stairs with a reciprocal gait without the rail.  Baseline:  Goal status: MET 04/04/24  4.  Pt will report he is able to perform his normal standing activities and IADL's without  significant pain and difficulty.  Baseline:  Goal status: MET 04/04/24  5.  Pt will score 0-1 on the lateral step down test for improved eccentric control and performance of stairs.   Baseline:  Goal status: MET 04/04/24    PLAN:  PT FREQUENCY: 1-2x/week  PT DURATION: other: 8-10 weeks  PLANNED INTERVENTIONS: 97164- PT Re-evaluation, 97750- Physical Performance Testing, 97110-Therapeutic exercises, 97530- Therapeutic activity, 97112- Neuromuscular re-education, 97535- Self Care, 02859- Manual therapy, [phone]- Gait training, [phone]- Aquatic Therapy, [phone]- Electrical stimulation (unattended), 97035- Ultrasound, 79439 (1-2 muscles), 20561 (3+ muscles)- Dry Needling, Patient/Family education, Balance training, Stair training, Taping, Joint mobilization, Scar mobilization, Cryotherapy, and Moist heat  PLAN FOR NEXT SESSION: R ankle/foot strength, proprioception training, and functional strengthening    Asberry Rodes, PTA  04/04/24 3:23 PM         "

## 2024-04-10 ENCOUNTER — Ambulatory Visit: Admitting: Podiatry
